# Patient Record
Sex: Male | Born: 1940 | Race: Black or African American | Hispanic: No | Marital: Married | Smoking: Never smoker
Health system: Southern US, Community
[De-identification: ages and names within clinical notes are randomized; demographics above are authoritative.]

## PROBLEM LIST (undated history)

## (undated) DIAGNOSIS — I10 Essential (primary) hypertension: Secondary | ICD-10-CM

## (undated) DIAGNOSIS — E785 Hyperlipidemia, unspecified: Secondary | ICD-10-CM

## (undated) DIAGNOSIS — E039 Hypothyroidism, unspecified: Secondary | ICD-10-CM

## (undated) DIAGNOSIS — M25469 Effusion, unspecified knee: Secondary | ICD-10-CM

## (undated) DIAGNOSIS — N400 Enlarged prostate without lower urinary tract symptoms: Secondary | ICD-10-CM

## (undated) DIAGNOSIS — F419 Anxiety disorder, unspecified: Secondary | ICD-10-CM

## (undated) DIAGNOSIS — E119 Type 2 diabetes mellitus without complications: Secondary | ICD-10-CM

## (undated) HISTORY — PX: BACK SURGERY: SHX140

## (undated) HISTORY — DX: Hyperlipidemia, unspecified: E78.5

## (undated) HISTORY — DX: Hypothyroidism, unspecified: E03.9

## (undated) HISTORY — DX: Benign prostatic hyperplasia without lower urinary tract symptoms: N40.0

## (undated) HISTORY — DX: Type 2 diabetes mellitus without complications: E11.9

## (undated) HISTORY — DX: Essential (primary) hypertension: I10

---

## 2004-09-23 ENCOUNTER — Ambulatory Visit: Payer: Self-pay | Admitting: Endocrinology

## 2006-07-14 ENCOUNTER — Ambulatory Visit: Payer: Self-pay | Admitting: Internal Medicine

## 2006-09-25 ENCOUNTER — Ambulatory Visit: Payer: Self-pay | Admitting: Internal Medicine

## 2007-11-16 ENCOUNTER — Encounter: Payer: Self-pay | Admitting: Internal Medicine

## 2007-11-26 ENCOUNTER — Telehealth (INDEPENDENT_AMBULATORY_CARE_PROVIDER_SITE_OTHER): Payer: Self-pay | Admitting: *Deleted

## 2007-11-28 ENCOUNTER — Encounter: Payer: Self-pay | Admitting: Internal Medicine

## 2008-10-13 ENCOUNTER — Telehealth: Payer: Self-pay | Admitting: Internal Medicine

## 2008-10-13 ENCOUNTER — Encounter: Payer: Self-pay | Admitting: Internal Medicine

## 2008-10-30 ENCOUNTER — Ambulatory Visit: Payer: Self-pay | Admitting: Internal Medicine

## 2008-10-30 DIAGNOSIS — E039 Hypothyroidism, unspecified: Secondary | ICD-10-CM

## 2008-10-30 DIAGNOSIS — R209 Unspecified disturbances of skin sensation: Secondary | ICD-10-CM

## 2009-03-20 ENCOUNTER — Telehealth: Payer: Self-pay | Admitting: Internal Medicine

## 2009-09-15 ENCOUNTER — Ambulatory Visit: Payer: Self-pay | Admitting: Internal Medicine

## 2009-09-22 ENCOUNTER — Encounter: Payer: Self-pay | Admitting: Internal Medicine

## 2009-09-22 ENCOUNTER — Ambulatory Visit: Payer: Self-pay | Admitting: Internal Medicine

## 2009-09-22 HISTORY — PX: COLONOSCOPY: SHX174

## 2009-09-22 LAB — HM COLONOSCOPY

## 2009-09-25 ENCOUNTER — Encounter: Payer: Self-pay | Admitting: Internal Medicine

## 2009-11-30 ENCOUNTER — Ambulatory Visit: Payer: Self-pay | Admitting: Internal Medicine

## 2010-06-23 ENCOUNTER — Telehealth (INDEPENDENT_AMBULATORY_CARE_PROVIDER_SITE_OTHER): Payer: Self-pay | Admitting: *Deleted

## 2010-06-30 ENCOUNTER — Telehealth (INDEPENDENT_AMBULATORY_CARE_PROVIDER_SITE_OTHER): Payer: Self-pay | Admitting: *Deleted

## 2010-07-05 ENCOUNTER — Telehealth (INDEPENDENT_AMBULATORY_CARE_PROVIDER_SITE_OTHER): Payer: Self-pay | Admitting: *Deleted

## 2010-09-16 ENCOUNTER — Ambulatory Visit: Payer: Self-pay | Admitting: Internal Medicine

## 2010-09-16 LAB — CONVERTED CEMR LAB
Albumin: 3.9 g/dL (ref 3.5–5.2)
Alkaline Phosphatase: 52 units/L (ref 39–117)
Basophils Absolute: 0 10*3/uL (ref 0.0–0.1)
Basophils Relative: 1.3 % (ref 0.0–3.0)
Bilirubin Urine: NEGATIVE
CO2: 25 meq/L (ref 19–32)
Calcium: 8.8 mg/dL (ref 8.4–10.5)
Chloride: 101 meq/L (ref 96–112)
Eosinophils Absolute: 0.1 10*3/uL (ref 0.0–0.7)
Glucose, Bld: 101 mg/dL — ABNORMAL HIGH (ref 70–99)
HDL: 30.6 mg/dL — ABNORMAL LOW (ref >39.00)
Hemoglobin: 12.7 g/dL — ABNORMAL LOW (ref 13.0–17.0)
Leukocytes, UA: NEGATIVE
Lymphocytes Relative: 44 % (ref 12.0–46.0)
Lymphs Abs: 1.6 10*3/uL (ref 0.7–4.0)
MCHC: 33.8 g/dL (ref 30.0–36.0)
MCV: 87.4 fL (ref 78.0–100.0)
Monocytes Absolute: 0.5 10*3/uL (ref 0.1–1.0)
Neutro Abs: 1.3 10*3/uL — ABNORMAL LOW (ref 1.4–7.7)
Nitrite: NEGATIVE
PSA: 0.53 ng/mL (ref 0.10–4.00)
RBC: 4.3 M/uL (ref 4.22–5.81)
RDW: 14.8 % — ABNORMAL HIGH (ref 11.5–14.6)
Sodium: 133 meq/L — ABNORMAL LOW (ref 135–145)
TSH: 4.51 microintl units/mL (ref 0.35–5.50)
Total Protein, Urine: NEGATIVE mg/dL
Total Protein: 7 g/dL (ref 6.0–8.3)
Triglycerides: 82 mg/dL (ref 0.0–149.0)
Urobilinogen, UA: 0.2 (ref 0.0–1.0)

## 2010-09-23 ENCOUNTER — Ambulatory Visit: Payer: Self-pay | Admitting: Internal Medicine

## 2010-09-23 ENCOUNTER — Encounter: Payer: Self-pay | Admitting: Internal Medicine

## 2010-09-23 DIAGNOSIS — N400 Enlarged prostate without lower urinary tract symptoms: Secondary | ICD-10-CM | POA: Insufficient documentation

## 2010-12-04 ENCOUNTER — Emergency Department (HOSPITAL_COMMUNITY)
Admission: EM | Admit: 2010-12-04 | Discharge: 2010-12-04 | Payer: Self-pay | Source: Home / Self Care | Admitting: Emergency Medicine

## 2010-12-19 LAB — CONVERTED CEMR LAB
Basophils Absolute: 0 10*3/uL (ref 0.0–0.1)
Bilirubin Urine: NEGATIVE
Bilirubin, Direct: 0.1 mg/dL (ref 0.0–0.3)
Calcium: 9.1 mg/dL (ref 8.4–10.5)
Creatinine, Ser: 0.9 mg/dL (ref 0.4–1.5)
Eosinophils Absolute: 0.1 10*3/uL (ref 0.0–0.7)
GFR calc Af Amer: 109 mL/min
HCT: 39.6 % (ref 39.0–52.0)
HDL: 28 mg/dL — ABNORMAL LOW (ref >39.0)
Hemoglobin: 13.3 g/dL (ref 13.0–17.0)
Ketones, ur: NEGATIVE mg/dL
Leukocytes, UA: NEGATIVE
MCHC: 33.6 g/dL (ref 30.0–36.0)
MCV: 88.8 fL (ref 78.0–100.0)
Monocytes Absolute: 0.5 10*3/uL (ref 0.1–1.0)
Neutro Abs: 1.3 10*3/uL — ABNORMAL LOW (ref 1.4–7.7)
PSA: 0.73 ng/mL (ref 0.10–4.00)
RDW: 14.1 % (ref 11.5–14.6)
Sodium: 139 meq/L (ref 135–145)
TSH: 1.64 microintl units/mL (ref 0.35–5.50)
Total Bilirubin: 0.8 mg/dL (ref 0.3–1.2)
Total CHOL/HDL Ratio: 5.3
Total Protein, Urine: NEGATIVE mg/dL
Total Protein: 7.5 g/dL (ref 6.0–8.3)
Triglycerides: 81 mg/dL (ref 0–149)
Urine Glucose: NEGATIVE mg/dL
Vitamin B-12: 280 pg/mL (ref 211–911)
pH: 6 (ref 5.0–8.0)

## 2010-12-23 NOTE — Assessment & Plan Note (Signed)
Summary: flu shot/plot/cd  Nurse Visit   Allergies: No Known Drug Allergies  Orders Added: 1)  Flu Vaccine 75yrs + [90658] 2)  Administration Flu vaccine - MCR [G0008] Flu Vaccine Consent Questions     Do you have a history of severe allergic reactions to this vaccine? no    Any prior history of allergic reactions to egg and/or gelatin? no    Do you have a sensitivity to the preservative Thimersol? no    Do you have a past history of Guillan-Barre Syndrome? no    Do you currently have an acute febrile illness? no    Have you ever had a severe reaction to latex? no    Vaccine information given and explained to patient? yes    Are you currently pregnant? no    Lot Number:[id number]   Exp Date:05/20/2010   Site Given  Left Deltoid IM.lbmedflu

## 2010-12-23 NOTE — Progress Notes (Signed)
  Phone Note Other Incoming   Request: Send information Summary of Call: Request for records received from [facility]. Request forwarded to [facility].     

## 2010-12-23 NOTE — Progress Notes (Signed)
  Phone Note Other Incoming   Request: Send information Action Taken: Software engineer of Call: Request for records received from [facility]. Request forwarded to [facility] to send last 5 years worth of records.

## 2010-12-23 NOTE — Assessment & Plan Note (Signed)
Summary: CPX/ SECURE HORIZIONS/NWS    #   Vital Signs:  Patient profile:   70 year old male Height:      73 inches Weight:      233 pounds BMI:     30.85 Temp:     98.5 degrees F oral Pulse rate:   72 / minute Pulse rhythm:   regular Resp:     16 per minute BP sitting:   110 / 76  (left arm) Cuff size:   regular  Vitals Entered By: Lanier Prude, Beverly Gust) (September 23, 2010 10:39 AM) CC: MWV Is Patient Diabetic? No Comments pt is not taking Vit b12   CC:  MWV.  History of Present Illness: The patient presents for a preventive health examination  Patient past medical history, social history, and family history reviewed in detail no significant changes.  Patient is physically active. Depression is negative and mood is good. Hearing is decreased, and able to perform activities of daily living. Risk of falling is negligible and home safety has been reviewed and is appropriate. Patient has normal height, some overweight, and visual acuity w/reading glasses. Patient has been counseled on age-appropriate routine health concerns for screening and prevention. Education, counseling done.   Preventive Screening-Counseling & Management  Alcohol-Tobacco     Alcohol drinks/day: <1     Smoking Status: quit > 6 months  Caffeine-Diet-Exercise     Caffeine Counseling: not indicated; caffeine use is not excessive or problematic     Diet Counseling: to improve diet; diet is suboptimal     Does Patient Exercise: yes     Type of exercise: walking     Exercise Counseling: not indicated; exercise is adequate     Depression Counseling: not indicated; screening negative for depression  Hep-HIV-STD-Contraception     Hepatitis Risk: no risk noted     Sun Exposure Counseling: to decrease sun exposure  Safety-Violence-Falls     Seat Belt Use: yes     Fall Risk Counseling: not indicated; no significant falls noted  Current Medications (verified): 1)  Levothyroxine Sodium 100 Mcg  Tabs  (Levothyroxine Sodium) .... Once Daily 2)  Aspirin 81 Mg  Tbec (Aspirin) .... One By Mouth Every Day 3)  Vitamin D3 1000 Unit  Tabs (Cholecalciferol) .Marland Kitchen.. 1 By Mouth Daily 4)  Vitamin B-12 Cr 1000 Mcg  Tbcr (Cyanocobalamin) .... Take One Tablet By Mouth Daily  Allergies (verified): No Known Drug Allergies  Past History:  Family History: Last updated: 11-18-08 F well 4 M died  Past Medical History: Hypothyroidism Benign prostatic hypertrophy  Social History: Smoking Status:  quit > 6 months Hepatitis Risk:  no risk noted Seat Belt Use:  yes  Review of Systems  The patient denies anorexia, fever, weight loss, weight gain, vision loss, decreased hearing, hoarseness, chest pain, syncope, dyspnea on exertion, peripheral edema, prolonged cough, headaches, hemoptysis, abdominal pain, melena, hematochezia, severe indigestion/heartburn, hematuria, incontinence, genital sores, muscle weakness, suspicious skin lesions, transient blindness, difficulty walking, depression, unusual weight change, abnormal bleeding, enlarged lymph nodes, angioedema, and testicular masses.    Physical Exam  General:  Well-developed, in no acute distress; alert,appropriate and cooperative throughout examination overweight-appearing.   Head:  Normocephalic and atraumatic without obvious abnormalities. No apparent alopecia or balding. Eyes:  No new corneal or conjunctival inflammation noted. EOMI. Perrla. Funduscopic exam benign, without hemorrhages, exudates or papilledema. Vision grossly normal. Ears:  External ear exam shows no significant lesions or deformities.  Otoscopic examination reveals clear canals, tympanic membranes  are intact bilaterally without bulging, retraction, inflammation or discharge. Hearing is grossly normal bilaterally. Nose:  External nasal examination shows no deformity or inflammation. Nasal mucosa are pink and moist without lesions or exudates. Mouth:  Oral mucosa and oropharynx  without lesions or exudates.  Teeth in good repair. Neck:  No deformities, masses, or tenderness noted. Lungs:  Normal respiratory effort, chest expands symmetrically. Lungs are clear to auscultation, no crackles or wheezes. Heart:  Normal rate and regular rhythm. S1 and S2 normal without gallop, murmur, click, rub or other extra sounds. Abdomen:  Bowel sounds positive,abdomen soft and non-tender without masses, organomegaly or hernias noted. Genitalia:  Testes bilaterally descended without nodularity, tenderness or masses. No scrotal masses or lesions. No penis lesions or urethral discharge. Prostate:  Prostate gland firm and smooth,1+ enlargement, nodularity, tenderness, mass, asymmetry or induration. Msk:  No deformity or scoliosis noted of thoracic or lumbar spine.   Pulses:  R and L carotid,radial,femoral,dorsalis pedis and posterior tibial pulses are full and equal bilaterally Extremities:  no edema B  Neurologic:  No cranial nerve deficits noted. Station and gait are normal. Plantar reflexes are down-going bilaterally. DTRs are symmetrical throughout. Sensory, motor and coordinative functions appear intact. Skin:  Intact without suspicious lesions or rashes Cervical Nodes:  No lymphadenopathy noted Psych:  Cognition and judgment appear intact. Alert and cooperative with normal attention span and concentration. No apparent delusions, illusions, hallucinations   Impression & Recommendations:  Problem # 1:  WELL ADULT EXAM (ICD-V70.0) Assessment New Overall doing well, age appropriate education and counseling updated and referral for appropriate preventive services done unless declined, immunizations up to date or declined, diet counseling done if overweight, urged to quit smoking if smokes, most recent labs reviewed and current ordered if appropriate, ecg reviewed or declined (interpretation per ECG scanned in the EMR if done); information regarding Medicare Preventation requirements given  if appropriate.  The labs were reviewed with the patient.  Orders: EKG w/ Interpretation (93000) Medicare -1st Annual Wellness Visit [phone]  Problem # 2:  HYPOTHYROIDISM (ICD-244.9) Assessment: Deteriorated  The following medications were removed from the medication list:    Levothyroxine Sodium 100 Mcg Tabs (Levothyroxine sodium) ..... Once daily His updated medication list for this problem includes:    Levoxyl 112 Mcg Tabs (Levothyroxine sodium) .Marland Kitchen... 1 by mouth qd  Problem # 3:  BENIGN PROSTATIC HYPERTROPHY (ICD-600.00) Assessment: Unchanged  Complete Medication List: 1)  Aspirin 81 Mg Tbec (Aspirin) .... One by mouth every day 2)  Vitamin D3 1000 Unit Tabs (Cholecalciferol) .Marland Kitchen.. 1 by mouth daily 3)  Vitamin B-12 Cr 1000 Mcg Tbcr (Cyanocobalamin) .... Take one tablet by mouth daily 4)  Levoxyl 112 Mcg Tabs (Levothyroxine sodium) .Marland Kitchen.. 1 by mouth qd 5)  Zostavax 47425 Unt/0.35ml Solr (Zoster vaccine live) .... As dirr 6)  Triamcinolone Acetonide 0.5 % Crea (Triamcinolone acetonide) .... Use two times a day prn  Other Orders: Pneumococcal Vaccine (95638) Admin 1st Vaccine (75643)  Patient Instructions: 1)  Please schedule a follow-up appointment in 4 months. 2)  BMP prior to visit, ICD-9:244.8 995.20 3)  TSH prior to visit, ICD-9: 4)  CBC w/ Diff prior to visit, ICD-9: 5)  Vit B12 782.0 Prescriptions: TRIAMCINOLONE ACETONIDE 0.5 % CREA (TRIAMCINOLONE ACETONIDE) use two times a day prn  #120 g x 3   Entered and Authorized by:   Tresa Garter MD   Signed by:   Tresa Garter MD on 09/23/2010   Method used:   Print then  Give to Patient   RxID:   [id number] ZOSTAVAX 14782 UNT/0.65ML SOLR (ZOSTER VACCINE LIVE) as dirr  #1 x 0   Entered and Authorized by:   Tresa Garter MD   Signed by:   Tresa Garter MD on 09/23/2010   Method used:   Print then Give to Patient   RxID:   [id number] LEVOXYL 112 MCG TABS (LEVOTHYROXINE SODIUM) 1 by mouth qd   #30 x 11   Entered and Authorized by:   Tresa Garter MD   Signed by:   Tresa Garter MD on 09/23/2010   Method used:   Print then Give to Patient   RxID:   [id number]    Orders Added: 1)  EKG w/ Interpretation [93000] 2)  Pneumococcal Vaccine [25366] 3)  Admin 1st Vaccine [90471] 4)  Medicare -1st Annual Wellness Visit [G0438] 5)  Est. Patient Level III [44034]   Immunizations Administered:  Pneumonia Vaccine:    Vaccine Type: Pneumovax    Site: left deltoid    Mfr: Merck    Dose: 0.5 ml    Route: IM    Given by: Lanier Prude, CMA(AAMA)    Exp. Date: 03/17/2012    Lot #: [id number]    VIS given: 10/26/09 version given September 23, 2010.   Immunizations Administered:  Pneumonia Vaccine:    Vaccine Type: Pneumovax    Site: left deltoid    Mfr: Merck    Dose: 0.5 ml    Route: IM    Given by: Lanier Prude, CMA(AAMA)    Exp. Date: 03/17/2012    Lot #: [id number]    VIS given: 10/26/09 version given September 23, 2010.

## 2010-12-23 NOTE — Progress Notes (Signed)
  Phone Note Other Incoming   Request: Send information Summary of Call: Request for records received from  [facility]. Request forwarded to [facility].

## 2010-12-30 ENCOUNTER — Ambulatory Visit (INDEPENDENT_AMBULATORY_CARE_PROVIDER_SITE_OTHER): Payer: MEDICARE | Admitting: Internal Medicine

## 2010-12-30 ENCOUNTER — Other Ambulatory Visit: Payer: MEDICARE

## 2010-12-30 ENCOUNTER — Encounter: Payer: Self-pay | Admitting: Internal Medicine

## 2010-12-30 ENCOUNTER — Other Ambulatory Visit: Payer: Self-pay | Admitting: Internal Medicine

## 2010-12-30 DIAGNOSIS — R42 Dizziness and giddiness: Secondary | ICD-10-CM

## 2010-12-30 DIAGNOSIS — H538 Other visual disturbances: Secondary | ICD-10-CM

## 2010-12-30 DIAGNOSIS — S060XAA Concussion with loss of consciousness status unknown, initial encounter: Secondary | ICD-10-CM | POA: Insufficient documentation

## 2010-12-30 DIAGNOSIS — R209 Unspecified disturbances of skin sensation: Secondary | ICD-10-CM

## 2010-12-30 DIAGNOSIS — S060X9A Concussion with loss of consciousness of unspecified duration, initial encounter: Secondary | ICD-10-CM | POA: Insufficient documentation

## 2010-12-30 DIAGNOSIS — R51 Headache: Secondary | ICD-10-CM

## 2010-12-30 LAB — CBC WITH DIFFERENTIAL/PLATELET
Basophils Absolute: 0 10*3/uL (ref 0.0–0.1)
Eosinophils Absolute: 0.2 10*3/uL (ref 0.0–0.7)
HCT: 38.2 % — ABNORMAL LOW (ref 39.0–52.0)
Hemoglobin: 13 g/dL (ref 13.0–17.0)
Lymphocytes Relative: 43.9 % (ref 12.0–46.0)
Lymphs Abs: 1.6 10*3/uL (ref 0.7–4.0)
MCHC: 34.1 g/dL (ref 30.0–36.0)
Neutro Abs: 1.4 10*3/uL (ref 1.4–7.7)
RDW: 14.6 % (ref 11.5–14.6)

## 2010-12-30 LAB — BASIC METABOLIC PANEL
CO2: 27 mEq/L (ref 19–32)
Calcium: 9.1 mg/dL (ref 8.4–10.5)
Glucose, Bld: 97 mg/dL (ref 70–99)
Sodium: 136 mEq/L (ref 135–145)
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
## 2011-01-20 ENCOUNTER — Other Ambulatory Visit: Payer: Self-pay

## 2011-01-27 ENCOUNTER — Ambulatory Visit (INDEPENDENT_AMBULATORY_CARE_PROVIDER_SITE_OTHER): Payer: Medicare Other | Admitting: Internal Medicine

## 2011-01-27 ENCOUNTER — Encounter: Payer: Self-pay | Admitting: Internal Medicine

## 2011-01-27 DIAGNOSIS — S060XAA Concussion with loss of consciousness status unknown, initial encounter: Secondary | ICD-10-CM

## 2011-01-27 DIAGNOSIS — S060X9A Concussion with loss of consciousness of unspecified duration, initial encounter: Secondary | ICD-10-CM

## 2011-01-27 DIAGNOSIS — H538 Other visual disturbances: Secondary | ICD-10-CM

## 2011-01-27 DIAGNOSIS — R350 Frequency of micturition: Secondary | ICD-10-CM | POA: Insufficient documentation

## 2011-01-28 ENCOUNTER — Ambulatory Visit: Payer: MEDICARE | Admitting: Internal Medicine

## 2011-02-01 ENCOUNTER — Telehealth: Payer: Self-pay | Admitting: Internal Medicine

## 2011-02-01 NOTE — Assessment & Plan Note (Signed)
Summary: 4 MO FU  Blake Zamora  Blake Zamora   Vital Signs:  Patient profile:   70 year old male Height:      73 inches Weight:      234 pounds BMI:     30.98 Temp:     98.5 degrees F oral Pulse rate:   64 / minute Pulse rhythm:   regular Resp:     16 per minute BP sitting:   112 / 76  (left arm) Cuff size:   regular  Vitals Entered By: Lanier Prude, CMA(AAMA) (January 27, 2011 8:55 AM) CC: 4 mo f/u c/o intermittent frequent urination Is Patient Diabetic? No   CC:  4 mo f/u c/o intermittent frequent urination.  History of Present Illness: C/o frequent urination x months - more at night - q 2 hrs x months  The patient presents for a follow up of anxiety, blurred vision and dizziness - better  Current Medications (verified): 1)  Aspirin 81 Mg  Tbec (Aspirin) .... One By Mouth Every Day 2)  Vitamin D3 1000 Unit  Tabs (Cholecalciferol) .Marland Kitchen.. 1 By Mouth Daily 3)  Vitamin B-12 Cr 1000 Mcg  Tbcr (Cyanocobalamin) .... Take One Tablet By Mouth Daily 4)  Levoxyl 112 Mcg Tabs (Levothyroxine Sodium) .Marland Kitchen.. 1 By Mouth Qd 5)  Zostavax 16109 Unt/0.53ml Solr (Zoster Vaccine Live) .... As Dirr 6)  Triamcinolone Acetonide 0.5 % Crea (Triamcinolone Acetonide) .... Use Two Times A Day Prn 7)  Ibuprofen 600 Mg Tabs (Ibuprofen) .Marland Kitchen.. 1 By Mouth Bid  Pc X 1 Wk Then As Needed For  Pain 8)  Meclizine Hcl 12.5 Mg Tabs (Meclizine Hcl) .Marland Kitchen.. 1-2 By Mouth Qid As Needed Dizziness 9)  Vitamin B Complex  Tabs (B Complex Vitamins) .Marland Kitchen.. 1 By Mouth Qd  Allergies (verified): No Known Drug Allergies  Past History:  Past Medical History: Last updated: 09/23/2010 Hypothyroidism Benign prostatic hypertrophy  Past Surgical History: Last updated: 12/30/2010 Denies surgical history  Social History: Last updated: 10/30/2008 Retired Married Regular exercise-yes  Review of Systems  The patient denies abdominal pain, genital sores, difficulty walking, and depression.    Physical Exam  General:  Well-developed, in no  acute distress; alert,appropriate and cooperative throughout examination overweight-appearing.   Nose:  External nasal examination shows no deformity or inflammation. Nasal mucosa are pink and moist without lesions or exudates. Mouth:  Oral mucosa and oropharynx without lesions or exudates.  Teeth in good repair. Lungs:  Normal respiratory effort, chest expands symmetrically. Lungs are clear to auscultation, no crackles or wheezes. Heart:  Normal rate and regular rhythm. S1 and S2 normal without gallop, murmur, click, rub or other extra sounds. Abdomen:  Bowel sounds positive,abdomen soft and non-tender without masses, organomegaly or hernias noted. Msk:  No deformity or scoliosis noted of thoracic or lumbar spine.   Neurologic:  No cranial nerve deficits noted. Station and gait are normal. Plantar reflexes are down-going bilaterally. DTRs are symmetrical throughout. Sensory, motor and coordinative functions appear intact. H-P (-) B. Skin:  Intact without suspicious lesions or rashes Cervical Nodes:  No lymphadenopathy noted Psych:  Cognition and judgment appear intact. Alert and cooperative with normal attention span and concentration. No apparent delusions, illusions, hallucinations   Impression & Recommendations:  Problem # 1:  FREQUENCY, URINARY (ICD-788.41) due to  BPH Assessment New  His updated medication list for this problem includes:    Flomax 0.4 Mg Caps (Tamsulosin hcl) .Marland Kitchen... 1 by mouth once daily for prostate to try The labs were reviewed  with the patient.   Problem # 2:  HYPOTHYROIDISM (ICD-244.9) Assessment: Unchanged  His updated medication list for this problem includes:    Levoxyl 112 Mcg Tabs (Levothyroxine sodium) .Marland Kitchen... 1 by mouth qd  Problem # 3:  BLURRED VISION (ICD-368.8) Assessment: Improved Resolved  Problem # 4:  CONCUSSION (ICD-850.9) Assessment: Improved No HAs lately  Problem # 5:  DIZZINESS (ICD-780.4) Assessment: Improved  His updated medication  list for this problem includes:    Meclizine Hcl 12.5 Mg Tabs (Meclizine hcl) .Marland Kitchen... 1-2 by mouth qid as needed dizziness  Complete Medication List: 1)  Aspirin 81 Mg Tbec (Aspirin) .... One by mouth every day 2)  Vitamin D3 1000 Unit Tabs (Cholecalciferol) .Marland Kitchen.. 1 by mouth daily 3)  Vitamin B-12 Cr 1000 Mcg Tbcr (Cyanocobalamin) .... Take one tablet by mouth daily 4)  Levoxyl 112 Mcg Tabs (Levothyroxine sodium) .Marland Kitchen.. 1 by mouth qd 5)  Zostavax 16109 Unt/0.22ml Solr (Zoster vaccine live) .... As dirr 6)  Triamcinolone Acetonide 0.5 % Crea (Triamcinolone acetonide) .... Use two times a day prn 7)  Ibuprofen 600 Mg Tabs (Ibuprofen) .Marland Kitchen.. 1 by mouth bid  pc x 1 wk then as needed for  pain 8)  Meclizine Hcl 12.5 Mg Tabs (Meclizine hcl) .Marland Kitchen.. 1-2 by mouth qid as needed dizziness 9)  Vitamin B Complex Tabs (B complex vitamins) .Marland Kitchen.. 1 by mouth qd 10)  Flomax 0.4 Mg Caps (Tamsulosin hcl) .Marland Kitchen.. 1 by mouth once daily for prostate  Patient Instructions: 1)  Please schedule a follow-up appointment in 2 months. Prescriptions: FLOMAX 0.4 MG CAPS (TAMSULOSIN HCL) 1 by mouth once daily for prostate  #30 x 12   Entered and Authorized by:   Tresa Garter MD   Signed by:   Tresa Garter MD on 01/27/2011   Method used:   Electronically to        [facility]  [address] [phone]* (retail)       [address]       [place], [place]  [id number]       Ph: [phone] or [phone]       Fax: [phone]   RxID:   [id number]    Orders Added: 1)  Est. Patient Level IV [42595]

## 2011-02-08 NOTE — Progress Notes (Signed)
Summary: RF  Phone Note Call from Patient Call back at Home Phone [phone]   Caller: Wife Summary of Call: Req rx for thyroid med to be changed to 60 day supply instead of 30 day supply. ? does pt mean 90 day supply? Need to call and clarify  Initial call taken by: Lamar Sprinkles, CMA,  February 01, 2011 2:23 PM  Follow-up for Phone Call        left vm for pt to call office back w/details for rx refill  Follow-up by: Lamar Sprinkles, CMA,  February 03, 2011 12:24 PM  Additional Follow-up for Phone Call Additional follow up Details #1::        Needs refill of thyroid med to [facility] [address] Additional Follow-up by: Lamar Sprinkles, CMA,  February 03, 2011 12:59 PM    Prescriptions: LEVOXYL 112 MCG TABS (LEVOTHYROXINE SODIUM) 1 by mouth qd  #90 x 1   Entered by:   Lamar Sprinkles, CMA   Authorized by:   Tresa Garter MD   Signed by:   Lamar Sprinkles, CMA on 02/03/2011   Method used:   [facility] to        [facility]  [address] [phone]* (retail)       [address]       [place], [place]  [id number]       Ph: [phone] or [phone]       Fax: [phone]   RxID:   [id number]

## 2011-04-05 ENCOUNTER — Encounter: Payer: Self-pay | Admitting: Internal Medicine

## 2011-04-07 ENCOUNTER — Ambulatory Visit (INDEPENDENT_AMBULATORY_CARE_PROVIDER_SITE_OTHER): Payer: Medicare Other | Admitting: Internal Medicine

## 2011-04-07 ENCOUNTER — Encounter: Payer: Self-pay | Admitting: Internal Medicine

## 2011-04-07 DIAGNOSIS — R42 Dizziness and giddiness: Secondary | ICD-10-CM

## 2011-04-07 DIAGNOSIS — E039 Hypothyroidism, unspecified: Secondary | ICD-10-CM

## 2011-04-07 DIAGNOSIS — S060X9A Concussion with loss of consciousness of unspecified duration, initial encounter: Secondary | ICD-10-CM

## 2011-04-07 NOTE — Progress Notes (Signed)
  Subjective:    Patient ID: Blake Zamora, male    DOB: [birth year], 70 y.o.   MRN: [id number]  HPI  F/u concussion and dizziness  Review of Systems  Constitutional: Negative for fatigue.  HENT: Negative for dental problem and tinnitus.   Eyes: Negative for pain.  Respiratory: Negative for cough.   Gastrointestinal: Negative for blood in stool.  Musculoskeletal: Negative for gait problem.  Skin: Negative for rash.  Neurological: Negative for dizziness and light-headedness.  Psychiatric/Behavioral: The patient is not nervous/anxious.        Objective:   Physical Exam  Constitutional: No distress.  HENT:  Mouth/Throat: No oropharyngeal exudate.  Neck: No JVD present.  Pulmonary/Chest: He has no wheezes.  Abdominal: He exhibits no distension.  Musculoskeletal: He exhibits no edema and no tenderness.  Neurological: No cranial nerve deficit. Coordination normal.  Skin: No rash noted.          Assessment & Plan:  CONCUSSION Concussion sx have resolved completely  DIZZINESS Improved  HYPOTHYROIDISM On Rx

## 2011-04-07 NOTE — Assessment & Plan Note (Signed)
Concussion

## 2011-04-07 NOTE — Assessment & Plan Note (Signed)
On Rx 

## 2011-04-07 NOTE — Assessment & Plan Note (Signed)
Improved

## 2011-04-10 ENCOUNTER — Encounter: Payer: Self-pay | Admitting: Internal Medicine

## 2011-08-11 ENCOUNTER — Other Ambulatory Visit: Payer: Self-pay | Admitting: Internal Medicine

## 2011-09-26 ENCOUNTER — Other Ambulatory Visit: Payer: Self-pay | Admitting: *Deleted

## 2011-09-26 DIAGNOSIS — Z0000 Encounter for general adult medical examination without abnormal findings: Secondary | ICD-10-CM

## 2011-09-26 DIAGNOSIS — Z0389 Encounter for observation for other suspected diseases and conditions ruled out: Secondary | ICD-10-CM

## 2011-09-29 ENCOUNTER — Other Ambulatory Visit: Payer: Medicare Other

## 2011-10-03 ENCOUNTER — Encounter: Payer: Self-pay | Admitting: Internal Medicine

## 2011-10-03 ENCOUNTER — Ambulatory Visit (INDEPENDENT_AMBULATORY_CARE_PROVIDER_SITE_OTHER): Payer: Medicare Other | Admitting: Internal Medicine

## 2011-10-03 ENCOUNTER — Other Ambulatory Visit (INDEPENDENT_AMBULATORY_CARE_PROVIDER_SITE_OTHER): Payer: Medicare Other

## 2011-10-03 VITALS — BP 140/80 | HR 72 | Temp 98.2°F | Resp 16 | Wt 230.0 lb

## 2011-10-03 DIAGNOSIS — Z136 Encounter for screening for cardiovascular disorders: Secondary | ICD-10-CM

## 2011-10-03 DIAGNOSIS — R350 Frequency of micturition: Secondary | ICD-10-CM

## 2011-10-03 DIAGNOSIS — IMO0001 Reserved for inherently not codable concepts without codable children: Secondary | ICD-10-CM

## 2011-10-03 DIAGNOSIS — E039 Hypothyroidism, unspecified: Secondary | ICD-10-CM

## 2011-10-03 DIAGNOSIS — N400 Enlarged prostate without lower urinary tract symptoms: Secondary | ICD-10-CM

## 2011-10-03 DIAGNOSIS — R209 Unspecified disturbances of skin sensation: Secondary | ICD-10-CM

## 2011-10-03 DIAGNOSIS — R030 Elevated blood-pressure reading, without diagnosis of hypertension: Secondary | ICD-10-CM

## 2011-10-03 DIAGNOSIS — Z0000 Encounter for general adult medical examination without abnormal findings: Secondary | ICD-10-CM

## 2011-10-03 DIAGNOSIS — Z125 Encounter for screening for malignant neoplasm of prostate: Secondary | ICD-10-CM

## 2011-10-03 MED ORDER — FINASTERIDE 5 MG PO TABS: 5.0000 mg | ORAL_TABLET | Freq: Every day | ORAL | Status: DC

## 2011-10-03 NOTE — Assessment & Plan Note (Signed)
Due to BPH - worse in 2012

## 2011-10-03 NOTE — Assessment & Plan Note (Signed)
Check labs 

## 2011-10-03 NOTE — Assessment & Plan Note (Signed)
Start Proscar. 

## 2011-10-03 NOTE — Assessment & Plan Note (Signed)
2012 Watch BP at home

## 2011-10-03 NOTE — Progress Notes (Signed)
  Subjective:    Patient ID: Blake Zamora, male    DOB: December 27, 1940, 70 y.o.   MRN: [id number]  HPI  The patient is here for a wellness exam. The patient has been doing well overall without major physical or psychological issues going on lately. 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  HENT: Negative for nosebleeds, congestion, sore throat, sneezing, trouble swallowing and neck pain.   Eyes: Negative for itching and visual disturbance.  Respiratory: Negative for cough.   Cardiovascular: Negative for chest pain, palpitations and leg swelling.  Gastrointestinal: Negative for nausea, diarrhea, blood in stool and abdominal distention.  Genitourinary: Positive for urgency, frequency and decreased urine volume. Negative for hematuria.  Musculoskeletal: Negative for back pain, joint swelling and gait problem.  Skin: Negative for rash.  Neurological: Negative for dizziness, tremors, speech difficulty and weakness.  Psychiatric/Behavioral: Negative for sleep disturbance, dysphoric mood and agitation. The patient is not nervous/anxious.        Objective:   Physical Exam  Constitutional: He is oriented to person, place, and time. He appears well-developed and well-nourished. No distress.  HENT:  Head: Normocephalic and atraumatic.  Right Ear: External ear normal.  Left Ear: External ear normal.  Nose: Nose normal.  Mouth/Throat: Oropharynx is clear and moist. No oropharyngeal exudate.  Eyes: Conjunctivae and EOM are normal. Pupils are equal, round, and reactive to light. Right eye exhibits no discharge. Left eye exhibits no discharge. No scleral icterus.  Neck: Normal range of motion. Neck supple. No JVD present. No tracheal deviation present. No thyromegaly present.  Cardiovascular: Normal rate, regular rhythm, normal heart sounds and  intact distal pulses.  Exam reveals no gallop and no friction rub.   No murmur heard. Pulmonary/Chest: Effort normal and breath sounds normal. No stridor. No respiratory distress. He has no wheezes. He has no rales. He exhibits no tenderness.  Abdominal: Soft. Bowel sounds are normal. He exhibits no distension and no mass. There is no tenderness. There is no rebound and no guarding.  Genitourinary: Rectum normal and penis normal. Guaiac negative stool. No penile tenderness.       Prostate is 2+  Musculoskeletal: Normal range of motion. He exhibits no edema and no tenderness.  Lymphadenopathy:    He has no cervical adenopathy.  Neurological: He is alert and oriented to person, place, and time. He has normal reflexes. No cranial nerve deficit. He exhibits normal muscle tone. Coordination normal.  Skin: Skin is warm and dry. No rash noted. He is not diaphoretic. No erythema. No pallor.  Psychiatric: He has a normal mood and affect. His behavior is normal. Judgment and thought content normal.          Assessment & Plan:

## 2011-10-03 NOTE — Assessment & Plan Note (Signed)
Continue with current prescription therapy as reflected on the Med list.  

## 2011-10-03 NOTE — Assessment & Plan Note (Addendum)
The patient is here for annual Medicare wellness examination and management of other chronic and acute problems.   The risk factors are reflected in the social history.  The roster of all physicians providing medical care to patient - is listed in the Snapshot section of the chart.  Activities of daily living:  The patient is 100% inedpendent in all ADLs: dressing, toileting, feeding as well as independent mobility  Home safety : The patient has smoke detectors in the home. They wear seatbelts.No firearms at home ( firearms are present in the home, kept in a safe fashion). There is no violence in the home.   There is no risks for hepatitis, STDs or HIV. There is no   history of blood transfusion. They have no travel history to infectious disease endemic areas of the world.  The patient has (has not) seen their dentist in the last six month. They have (not) seen their eye doctor in the last year. They deny (admit to) any hearing difficulty and have not had audiologic testing in the last year.  They do not  have excessive sun exposure. Discussed the need for sun protection: hats, long sleeves and use of sunscreen if there is significant sun exposure.   Diet: the importance of a healthy diet is discussed. They do have a healthy (unhealthy-high fat/fast food) diet.  The patient has a regular exercise program: _______ , ____duration, __3___per week.  The benefits of regular aerobic exercise were discussed.  Depression screen: there are no signs or vegative symptoms of depression- irritability, change in appetite, anhedonia, sadness/tearfullness.  Cognitive assessment: the patient manages all their financial and personal affairs and is actively engaged. They could relate day,date,year and events; recalled 3/3 objects at 3 minutes; performed clock-face test normally.  The following portions of the patient's history were reviewed and updated as appropriate: allergies, current medications, past family  history, past medical history,  past surgical history, past social history  and problem list.  Vision, hearing, body mass index were assessed and reviewed.   During the course of the visit the patient was educated and counseled about appropriate screening and preventive services including : fall prevention , diabetes screening, nutrition counseling, colorectal cancer screening, and recommended immunizations.  [facility] info given Forms for [facility] filled out Watch BP at home and RTC in 3 mo

## 2011-10-04 ENCOUNTER — Telehealth: Payer: Self-pay | Admitting: Internal Medicine

## 2011-10-04 LAB — HEPATIC FUNCTION PANEL
ALT: 30 U/L (ref 0–53)
AST: 26 U/L (ref 0–37)
Bilirubin, Direct: 0.1 mg/dL (ref 0.0–0.3)
Total Bilirubin: 0.7 mg/dL (ref 0.3–1.2)

## 2011-10-04 LAB — CBC WITH DIFFERENTIAL/PLATELET
Basophils Relative: 0.7 % (ref 0.0–3.0)
Eosinophils Absolute: 0.1 10*3/uL (ref 0.0–0.7)
Eosinophils Relative: 4.1 % (ref 0.0–5.0)
Hemoglobin: 13.1 g/dL (ref 13.0–17.0)
Lymphocytes Relative: 43.2 % (ref 12.0–46.0)
MCHC: 32.5 g/dL (ref 30.0–36.0)
Monocytes Relative: 14 % — ABNORMAL HIGH (ref 3.0–12.0)
Neutro Abs: 1.4 10*3/uL (ref 1.4–7.7)
Neutrophils Relative %: 38 % — ABNORMAL LOW (ref 43.0–77.0)
RBC: 4.46 Mil/uL (ref 4.22–5.81)
WBC: 3.6 10*3/uL — ABNORMAL LOW (ref 4.5–10.5)

## 2011-10-04 LAB — BASIC METABOLIC PANEL
BUN: 16 mg/dL (ref 6–23)
CO2: 27 mEq/L (ref 19–32)
Chloride: 107 mEq/L (ref 96–112)
Creatinine, Ser: 0.9 mg/dL (ref 0.4–1.5)
Glucose, Bld: 101 mg/dL — ABNORMAL HIGH (ref 70–99)
Potassium: 4.4 mEq/L (ref 3.5–5.1)

## 2011-10-04 LAB — LIPID PANEL
Cholesterol: 140 mg/dL (ref 0–200)
LDL Cholesterol: 86 mg/dL (ref 0–99)
Total CHOL/HDL Ratio: 4
Triglycerides: 110 mg/dL (ref 0.0–149.0)
VLDL: 22 mg/dL (ref 0.0–40.0)

## 2011-10-04 LAB — URINALYSIS
Bilirubin Urine: NEGATIVE
Nitrite: NEGATIVE
Total Protein, Urine: NEGATIVE
Urine Glucose: NEGATIVE
pH: 6 (ref 5.0–8.0)

## 2011-10-04 LAB — PSA: PSA: 0.77 ng/mL (ref 0.10–4.00)

## 2011-10-04 NOTE — Telephone Encounter (Signed)
Blake Zamora, please, inform patient that all labs are ok Thx 

## 2011-10-05 NOTE — Telephone Encounter (Signed)
Left detailed mess informing pt of below.  

## 2011-10-05 NOTE — Telephone Encounter (Signed)
No answer no machine

## 2012-01-09 ENCOUNTER — Ambulatory Visit: Payer: Medicare Other | Admitting: Internal Medicine

## 2012-02-15 ENCOUNTER — Other Ambulatory Visit: Payer: Self-pay | Admitting: Internal Medicine

## 2012-08-16 ENCOUNTER — Other Ambulatory Visit: Payer: Self-pay | Admitting: Internal Medicine

## 2012-09-03 ENCOUNTER — Encounter (HOSPITAL_COMMUNITY): Payer: Self-pay | Admitting: *Deleted

## 2012-09-03 ENCOUNTER — Emergency Department (HOSPITAL_COMMUNITY): Payer: Medicare Other

## 2012-09-03 DIAGNOSIS — R079 Chest pain, unspecified: Secondary | ICD-10-CM | POA: Insufficient documentation

## 2012-09-03 DIAGNOSIS — Z79899 Other long term (current) drug therapy: Secondary | ICD-10-CM | POA: Insufficient documentation

## 2012-09-03 DIAGNOSIS — E039 Hypothyroidism, unspecified: Secondary | ICD-10-CM | POA: Insufficient documentation

## 2012-09-03 DIAGNOSIS — F411 Generalized anxiety disorder: Secondary | ICD-10-CM | POA: Insufficient documentation

## 2012-09-03 LAB — COMPREHENSIVE METABOLIC PANEL
AST: 27 U/L (ref 0–37)
Albumin: 4 g/dL (ref 3.5–5.2)
Alkaline Phosphatase: 63 U/L (ref 39–117)
Chloride: 105 mEq/L (ref 96–112)
Potassium: 3.8 mEq/L (ref 3.5–5.1)
Total Bilirubin: 0.3 mg/dL (ref 0.3–1.2)
Total Protein: 7.4 g/dL (ref 6.0–8.3)

## 2012-09-03 LAB — CBC WITH DIFFERENTIAL/PLATELET
Basophils Absolute: 0 10*3/uL (ref 0.0–0.1)
Basophils Relative: 0 % (ref 0–1)
Eosinophils Absolute: 0.2 10*3/uL (ref 0.0–0.7)
MCHC: 33.9 g/dL (ref 30.0–36.0)
Neutro Abs: 2.1 10*3/uL (ref 1.7–7.7)
Neutrophils Relative %: 42 % — ABNORMAL LOW (ref 43–77)
Platelets: 163 10*3/uL (ref 150–400)
RDW: 13.3 % (ref 11.5–15.5)

## 2012-09-03 NOTE — ED Notes (Signed)
The pt has had lt upper chest pain since yesterday.  No nv dizziness or sob

## 2012-09-04 ENCOUNTER — Emergency Department (HOSPITAL_COMMUNITY)
Admission: EM | Admit: 2012-09-04 | Discharge: 2012-09-04 | Disposition: A | Discharge status: Home / Self Care | Payer: Medicare Other | Attending: Emergency Medicine | Admitting: Emergency Medicine

## 2012-09-04 DIAGNOSIS — R079 Chest pain, unspecified: Secondary | ICD-10-CM

## 2012-09-04 DIAGNOSIS — F419 Anxiety disorder, unspecified: Secondary | ICD-10-CM

## 2012-09-04 LAB — TROPONIN I: Troponin I: <0.3 ng/mL (ref <0.30)

## 2012-09-04 NOTE — ED Provider Notes (Signed)
History     CSN: [id number]  Arrival date & time 09/03/12  2224   First MD Initiated Contact with Patient 09/04/12 0247      Chief Complaint  Patient presents with  . Chest Pain    (Consider location/radiation/quality/duration/timing/severity/associated sxs/prior treatment) HPI Comments: 71 year old male presents emergency department with his wife complaining of sudden onset left-sided chest pain that woke him up from sleep Sunday night. States the pain was sharp, non-radiating and lasted only a second. He went back to sleep up the next morning and went through his day without any problem until 5:30 in the afternoon while he was driving the pain came on again second time it was less intense than when it woke him up from sleep. Has been intermittent since. Denies nausea, vomiting, diaphoresis, shortness of breath, cough. He's a nonsmoker. Denies any family history of early heart disease. 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 If he has been under increased stress. Says he is stressed to the point where will wake him up in the middle of the night needing to complete a task.   Patient is a 71 y.o. male presenting with chest pain. The history is provided by the patient and the spouse.  Chest Pain Pertinent negatives for primary symptoms include no shortness of breath, no cough, no palpitations, no nausea and no dizziness.  Pertinent negatives for associated symptoms include no diaphoresis and no weakness.     Past Medical History  Diagnosis Date  . Hypothyroidism   . BPH (benign prostatic hypertrophy)     History reviewed. No pertinent past surgical history.  Family History  Problem Relation Age of Onset  . Heart disease Mother 91    MI  . Cancer Father 73    lung    History  Substance Use Topics  . Smoking status: Never Smoker   . Smokeless tobacco: Not on file  . Alcohol Use: No       Review of Systems  Constitutional: Negative for diaphoresis, activity change and appetite change.  HENT: Negative for neck pain and neck stiffness.   Respiratory: Negative for cough, chest tightness and shortness of breath.   Cardiovascular: Positive for chest pain. Negative for palpitations and leg swelling.  Gastrointestinal: Negative for nausea.  Musculoskeletal: Negative for back pain.  Skin: Negative for pallor.  Neurological: Negative for dizziness, weakness, light-headedness and headaches.  Psychiatric/Behavioral: Negative for confusion.       Positive for increased stress    Allergies  Review of patient's allergies indicates no known allergies.  Home Medications   Current Outpatient Rx  Name Route Sig Dispense Refill  . ASPIRIN 81 MG PO TBEC Oral Take 81 mg by mouth daily.      Marland Kitchen VITAMIN D3 1000 UNITS PO TABS Oral Take 1,000 Units by mouth daily.      . OMEGA-3 FATTY ACIDS 1000 MG PO CAPS Oral Take 1 g by mouth daily.      Marland Kitchen LEVOTHYROXINE SODIUM 112 MCG PO TABS  TAKE 1 TABLET BY MOUTH DAILY 90 tablet 0  . NAPROXEN SODIUM 220 MG PO TABS Oral Take 220 mg by mouth 2 (two) times daily as needed. For pain    . VITAMIN B-12 1000 MCG PO TABS Oral Take 1,000 mcg by mouth daily.        BP 134/77  Pulse 64  Temp 97.6 F (36.4 C) (Oral)  Resp  18  SpO2 95%  Physical Exam  Constitutional: He is oriented to person, place, and time. He appears well-developed and well-nourished. No distress.  HENT:  Head: Normocephalic and atraumatic.  Mouth/Throat: Oropharynx is clear and moist.  Eyes: Conjunctivae normal and EOM are normal. Pupils are equal, round, and reactive to light.  Neck: Normal range of motion. Neck supple.  Cardiovascular: Normal rate, regular rhythm, normal heart sounds and intact distal pulses.        No extremity edema  Pulmonary/Chest: Effort normal and breath sounds normal. He exhibits no tenderness.  Abdominal: Soft. Bowel sounds are normal. There is no  tenderness.  Musculoskeletal: Normal range of motion.  Neurological: He is alert and oriented to person, place, and time.  Skin: Skin is warm and dry. No rash noted. He is not diaphoretic.  Psychiatric: He has a normal mood and affect. His speech is normal and behavior is normal.    ED Course  Procedures (including critical care time)  Labs Reviewed  CBC WITH DIFFERENTIAL - Abnormal; Notable for the following:    Hemoglobin 12.7 (*)     HCT 37.5 (*)     Neutrophils Relative 42 (*)     All other components within normal limits  COMPREHENSIVE METABOLIC PANEL - Abnormal; Notable for the following:    Glucose, Bld 110 (*)     GFR calc non Af Amer 84 (*)     All other components within normal limits  TROPONIN I   Date: 09/04/2012  Rate: 54  Rhythm: sinus bradycardia  QRS Axis: normal  Intervals: normal  ST/T Wave abnormalities: normal  Conduction Disutrbances:none  Narrative Interpretation: no stemi  Old EKG Reviewed: none available    1. Chest pain   2. Anxiety       MDM  71 y/o male with sudden onset intermittent chest pain. Denies any associated symptoms. Currently pain free. Non smoker. No family hx of heart disease. Exam unremarkable. Troponin negative. CXR showing mild cardiomegaly. Wife states he has been doing heavy lifting and under increased stress. Obtaining 2 hour troponin- if negative, will send home with PCP follow up. Advised him to discuss stress and anxiety with PCP. Case discussed with Dr. Hyacinth Meeker who agrees with plan of care. 4:06 AM Second troponin negative. Patient discharged.       Trevor Mace, PA-C 09/04/12 [phone]

## 2012-09-04 NOTE — ED Notes (Signed)
Pt discharged.Vital signs stable and GCS 15 

## 2012-09-04 NOTE — ED Notes (Signed)
71 y/o male, awoke with sharp CP 24 hours ago - found it difficult to go back to sleep but awoke asymptomatic, then again in the last 10 hours - no radiation, no associated sx.  Has been intermittent, lasts only a second.  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  Physical - has no reproducible pain, no murmurs, clear lungs, no edema.  ECG shows.  Sinus bradycardia with no signs of acute ischemia.  The patient has very atypical pain for either cardiac ischemia or pulmonary embolism, no further testing is required at this time, the patient has 2 sets of negative troponins and can be discharged home to followup with cardiology as outpatient. I discussed the signs with the patient and the spouse and they are agreeable with the plan.  Medical screening examination/treatment/procedure(s) were conducted as a shared visit with non-physician practitioner(s) and myself.  I personally evaluated the patient during the encounter    Vida Roller, MD 09/04/12 [phone]

## 2012-10-16 ENCOUNTER — Other Ambulatory Visit (INDEPENDENT_AMBULATORY_CARE_PROVIDER_SITE_OTHER): Payer: Medicare Other

## 2012-10-16 ENCOUNTER — Ambulatory Visit (INDEPENDENT_AMBULATORY_CARE_PROVIDER_SITE_OTHER): Payer: Medicare Other | Admitting: Internal Medicine

## 2012-10-16 ENCOUNTER — Encounter: Payer: Self-pay | Admitting: Internal Medicine

## 2012-10-16 VITALS — BP 134/90 | HR 76 | Temp 97.4°F | Resp 16 | Ht 73.0 in | Wt 227.0 lb

## 2012-10-16 DIAGNOSIS — Z23 Encounter for immunization: Secondary | ICD-10-CM

## 2012-10-16 DIAGNOSIS — R350 Frequency of micturition: Secondary | ICD-10-CM

## 2012-10-16 DIAGNOSIS — IMO0001 Reserved for inherently not codable concepts without codable children: Secondary | ICD-10-CM

## 2012-10-16 DIAGNOSIS — S060X9A Concussion with loss of consciousness of unspecified duration, initial encounter: Secondary | ICD-10-CM

## 2012-10-16 DIAGNOSIS — E039 Hypothyroidism, unspecified: Secondary | ICD-10-CM

## 2012-10-16 DIAGNOSIS — N400 Enlarged prostate without lower urinary tract symptoms: Secondary | ICD-10-CM

## 2012-10-16 DIAGNOSIS — H11009 Unspecified pterygium of unspecified eye: Secondary | ICD-10-CM

## 2012-10-16 DIAGNOSIS — R202 Paresthesia of skin: Secondary | ICD-10-CM

## 2012-10-16 DIAGNOSIS — R030 Elevated blood-pressure reading, without diagnosis of hypertension: Secondary | ICD-10-CM

## 2012-10-16 DIAGNOSIS — Z0000 Encounter for general adult medical examination without abnormal findings: Secondary | ICD-10-CM

## 2012-10-16 DIAGNOSIS — N320 Bladder-neck obstruction: Secondary | ICD-10-CM

## 2012-10-16 DIAGNOSIS — S060XAA Concussion with loss of consciousness status unknown, initial encounter: Secondary | ICD-10-CM

## 2012-10-16 DIAGNOSIS — Z79899 Other long term (current) drug therapy: Secondary | ICD-10-CM

## 2012-10-16 LAB — HEPATIC FUNCTION PANEL
ALT: 30 U/L (ref 0–53)
Albumin: 4.1 g/dL (ref 3.5–5.2)
Alkaline Phosphatase: 57 U/L (ref 39–117)
Bilirubin, Direct: 0.1 mg/dL (ref 0.0–0.3)
Total Protein: 7.4 g/dL (ref 6.0–8.3)

## 2012-10-16 LAB — URINALYSIS, ROUTINE W REFLEX MICROSCOPIC
Ketones, ur: NEGATIVE
Specific Gravity, Urine: 1.025 (ref 1.000–1.030)
Total Protein, Urine: NEGATIVE
Urine Glucose: NEGATIVE

## 2012-10-16 LAB — CBC WITH DIFFERENTIAL/PLATELET
Basophils Relative: 1.3 % (ref 0.0–3.0)
Eosinophils Relative: 6.1 % — ABNORMAL HIGH (ref 0.0–5.0)
Lymphocytes Relative: 37.9 % (ref 12.0–46.0)
MCV: 87.9 fl (ref 78.0–100.0)
Monocytes Absolute: 0.6 10*3/uL (ref 0.1–1.0)
Monocytes Relative: 16.9 % — ABNORMAL HIGH (ref 3.0–12.0)
Neutrophils Relative %: 37.8 % — ABNORMAL LOW (ref 43.0–77.0)
Platelets: 161 10*3/uL (ref 150.0–400.0)
RBC: 4.6 Mil/uL (ref 4.22–5.81)
WBC: 3.3 10*3/uL — ABNORMAL LOW (ref 4.5–10.5)

## 2012-10-16 LAB — LIPID PANEL
Cholesterol: 123 mg/dL (ref 0–200)
LDL Cholesterol: 75 mg/dL (ref 0–99)
Triglycerides: 86 mg/dL (ref 0.0–149.0)
VLDL: 17.2 mg/dL (ref 0.0–40.0)

## 2012-10-16 LAB — BASIC METABOLIC PANEL
BUN: 16 mg/dL (ref 6–23)
Calcium: 9.1 mg/dL (ref 8.4–10.5)
Chloride: 102 mEq/L (ref 96–112)
Creatinine, Ser: 1 mg/dL (ref 0.4–1.5)
GFR: 99.87 mL/min (ref >60.00)

## 2012-10-16 LAB — HEMOGLOBIN A1C: Hgb A1c MFr Bld: 6.2 % (ref 4.6–6.5)

## 2012-10-16 LAB — TSH: TSH: 0.8 u[IU]/mL (ref 0.35–5.50)

## 2012-10-16 MED ORDER — TERAZOSIN HCL 2 MG PO CAPS: 2.0000 mg | ORAL_CAPSULE | Freq: Every day | ORAL | Status: DC

## 2012-10-16 NOTE — Assessment & Plan Note (Signed)
Discussed - no HAs now

## 2012-10-16 NOTE — Assessment & Plan Note (Signed)
Labs - start Hytrin

## 2012-10-16 NOTE — Assessment & Plan Note (Signed)
Labs A1c 

## 2012-10-16 NOTE — Assessment & Plan Note (Signed)
labs

## 2012-10-16 NOTE — Assessment & Plan Note (Signed)
Start Hytrin 

## 2012-10-16 NOTE — Progress Notes (Signed)
Subjective:    Patient ID: Blake Zamora, male    DOB: Apr 18, 1941, 71 y.o.   MRN: [id number]  HPI  The patient is here for a wellness exam. The patient has been doing well overall without major physical or psychological issues going on lately. The patient needs to address chronic BPH that has not been well controlled with medicines  Wt Readings from Last 3 Encounters:  10/16/12 227 lb (102.967 kg)  10/03/11 230 lb (104.327 kg)  04/07/11 227 lb (102.967 kg)   BP Readings from Last 3 Encounters:  10/16/12 134/90  09/04/12 134/77  10/03/11 140/80       Review of Systems  Constitutional: Negative for appetite change, fatigue and unexpected weight change.  HENT: Positive for hearing loss (mild). Negative for nosebleeds, congestion, sore throat, sneezing, trouble swallowing and neck pain.   Eyes: Negative for itching and visual disturbance.  Respiratory: Negative for cough.   Cardiovascular: Negative for chest pain, palpitations and leg swelling.  Gastrointestinal: Negative for nausea, diarrhea, blood in stool and abdominal distention.  Genitourinary: Positive for urgency, frequency and decreased urine volume. Negative for hematuria.  Musculoskeletal: Negative for back pain, joint swelling and gait problem.  Skin: Negative for rash.  Neurological: Negative for dizziness, tremors, speech difficulty and weakness.  Psychiatric/Behavioral: Negative for suicidal ideas, sleep disturbance, dysphoric mood and agitation. The patient is not nervous/anxious.        Objective:   Physical Exam  Constitutional: He is oriented to person, place, and time. He appears well-developed and well-nourished. No distress.  HENT:  Head: Normocephalic and atraumatic.  Right Ear: External ear normal.  Left Ear: External ear normal.  Nose: Nose normal.  Mouth/Throat: Oropharynx is clear and moist. No oropharyngeal exudate.       R eye pterygium  Eyes: Conjunctivae normal and EOM are normal. Pupils are  equal, round, and reactive to light. Right eye exhibits no discharge. Left eye exhibits no discharge. No scleral icterus.  Neck: Normal range of motion. Neck supple. No JVD present. No tracheal deviation present. No thyromegaly present.  Cardiovascular: Normal rate, regular rhythm, normal heart sounds and intact distal pulses.  Exam reveals no gallop and no friction rub.   No murmur heard. Pulmonary/Chest: Effort normal and breath sounds normal. No stridor. No respiratory distress. He has no wheezes. He has no rales. He exhibits no tenderness.  Abdominal: Soft. Bowel sounds are normal. He exhibits no distension and no mass. There is no tenderness. There is no rebound and no guarding.  Genitourinary: Rectum normal and penis normal. Guaiac negative stool. No penile tenderness.       Prostate is 2+  Musculoskeletal: Normal range of motion. He exhibits no edema and no tenderness.  Lymphadenopathy:    He has no cervical adenopathy.  Neurological: He is alert and oriented to person, place, and time. He has normal reflexes. No cranial nerve deficit. He exhibits normal muscle tone. Coordination normal.  Skin: Skin is warm and dry. No rash noted. He is not diaphoretic. No erythema. No pallor.  Psychiatric: He has a normal mood and affect. His behavior is normal. Judgment and thought content normal.    Lab Results  Component Value Date   WBC 5.0 09/03/2012   HGB 12.7* 09/03/2012   HCT 37.5* 09/03/2012   PLT 163 09/03/2012   GLUCOSE 110* 09/03/2012   CHOL 140 10/03/2011   TRIG 110.0 10/03/2011   HDL 32.40* 10/03/2011   LDLCALC 86 10/03/2011   ALT 23 09/03/2012  AST 27 09/03/2012   NA 140 09/03/2012   K 3.8 09/03/2012   CL 105 09/03/2012   CREATININE 0.93 09/03/2012   BUN 18 09/03/2012   CO2 26 09/03/2012   TSH 1.21 10/03/2011   PSA 0.77 10/03/2011         Assessment & Plan:

## 2012-10-16 NOTE — Assessment & Plan Note (Signed)
The patient is here for annual Medicare wellness examination and management of other chronic and acute problems.   The risk factors are reflected in the social history.  The roster of all physicians providing medical care to patient - is listed in the Snapshot section of the chart.  Activities of daily living:  The patient is 100% inedpendent in all ADLs: dressing, toileting, feeding as well as independent mobility  Home safety : The patient has smoke detectors in the home. They wear seatbelts.No firearms at home ( firearms are present in the home, kept in a safe fashion). There is no violence in the home.   There is no risks for hepatitis, STDs or HIV. There is no   history of blood transfusion. They have no travel history to infectious disease endemic areas of the world.  The patient has (has not) seen their dentist in the last six month. They have (not) seen their eye doctor in the last year. They deny (admit to) any hearing difficulty and have not had audiologic testing in the last year.  They do not  have excessive sun exposure. Discussed the need for sun protection: hats, long sleeves and use of sunscreen if there is significant sun exposure.   Diet: the importance of a healthy diet is discussed. They do have a healthy (unhealthy-high fat/fast food) diet.  The patient has a regular exercise program: _______ , ____duration, __3___per week.  The benefits of regular aerobic exercise were discussed.  Depression screen: there are no signs or vegative symptoms of depression- irritability, change in appetite, anhedonia, sadness/tearfullness.  Cognitive assessment: the patient manages all their financial and personal affairs and is actively engaged. They could relate day,date,year and events; recalled 3/3 objects at 3 minutes; performed clock-face test normally.  The following portions of the patient's history were reviewed and updated as appropriate: allergies, current medications, past family  history, past medical history,  past surgical history, past social history  and problem list.  Vision, hearing, body mass index were assessed and reviewed.   During the course of the visit the patient was educated and counseled about appropriate screening and preventive services including : fall prevention , diabetes screening, nutrition counseling, colorectal cancer screening, and recommended immunizations.

## 2012-11-24 ENCOUNTER — Other Ambulatory Visit: Payer: Self-pay | Admitting: Internal Medicine

## 2013-05-16 ENCOUNTER — Other Ambulatory Visit: Payer: Self-pay | Admitting: Internal Medicine

## 2013-11-05 ENCOUNTER — Other Ambulatory Visit: Payer: Self-pay | Admitting: Internal Medicine

## 2013-11-28 ENCOUNTER — Ambulatory Visit (INDEPENDENT_AMBULATORY_CARE_PROVIDER_SITE_OTHER): Payer: Medicare Other | Admitting: Internal Medicine

## 2013-11-28 ENCOUNTER — Encounter: Payer: Self-pay | Admitting: Internal Medicine

## 2013-11-28 ENCOUNTER — Other Ambulatory Visit (INDEPENDENT_AMBULATORY_CARE_PROVIDER_SITE_OTHER): Payer: Medicare Other

## 2013-11-28 VITALS — BP 132/78 | HR 53 | Temp 98.4°F | Ht 72.25 in | Wt 231.5 lb

## 2013-11-28 DIAGNOSIS — R030 Elevated blood-pressure reading, without diagnosis of hypertension: Secondary | ICD-10-CM

## 2013-11-28 DIAGNOSIS — M545 Low back pain, unspecified: Secondary | ICD-10-CM

## 2013-11-28 DIAGNOSIS — R7309 Other abnormal glucose: Secondary | ICD-10-CM

## 2013-11-28 DIAGNOSIS — H11001 Unspecified pterygium of right eye: Secondary | ICD-10-CM

## 2013-11-28 DIAGNOSIS — Z23 Encounter for immunization: Secondary | ICD-10-CM

## 2013-11-28 DIAGNOSIS — E039 Hypothyroidism, unspecified: Secondary | ICD-10-CM

## 2013-11-28 DIAGNOSIS — N320 Bladder-neck obstruction: Secondary | ICD-10-CM

## 2013-11-28 DIAGNOSIS — H11009 Unspecified pterygium of unspecified eye: Secondary | ICD-10-CM

## 2013-11-28 DIAGNOSIS — R739 Hyperglycemia, unspecified: Secondary | ICD-10-CM

## 2013-11-28 DIAGNOSIS — Z0000 Encounter for general adult medical examination without abnormal findings: Secondary | ICD-10-CM

## 2013-11-28 DIAGNOSIS — IMO0001 Reserved for inherently not codable concepts without codable children: Secondary | ICD-10-CM

## 2013-11-28 LAB — CBC WITH DIFFERENTIAL/PLATELET
BASOS ABS: 0 10*3/uL (ref 0.0–0.1)
Basophils Relative: 0.4 % (ref 0.0–3.0)
EOS ABS: 0.2 10*3/uL (ref 0.0–0.7)
Eosinophils Relative: 5.8 % — ABNORMAL HIGH (ref 0.0–5.0)
HCT: 38.3 % — ABNORMAL LOW (ref 39.0–52.0)
Hemoglobin: 12.8 g/dL — ABNORMAL LOW (ref 13.0–17.0)
LYMPHS PCT: 32.8 % (ref 12.0–46.0)
Lymphs Abs: 1.3 10*3/uL (ref 0.7–4.0)
MCHC: 33.4 g/dL (ref 30.0–36.0)
MCV: 85.7 fl (ref 78.0–100.0)
MONO ABS: 0.6 10*3/uL (ref 0.1–1.0)
Monocytes Relative: 14.7 % — ABNORMAL HIGH (ref 3.0–12.0)
NEUTROS PCT: 46.3 % (ref 43.0–77.0)
Neutro Abs: 1.8 10*3/uL (ref 1.4–7.7)
PLATELETS: 176 10*3/uL (ref 150.0–400.0)
RBC: 4.47 Mil/uL (ref 4.22–5.81)
RDW: 14.9 % — AB (ref 11.5–14.6)
WBC: 3.9 10*3/uL — ABNORMAL LOW (ref 4.5–10.5)

## 2013-11-28 LAB — HEPATIC FUNCTION PANEL
ALT: 24 U/L (ref 0–53)
AST: 23 U/L (ref 0–37)
Albumin: 4.2 g/dL (ref 3.5–5.2)
Alkaline Phosphatase: 53 U/L (ref 39–117)
BILIRUBIN DIRECT: 0.1 mg/dL (ref 0.0–0.3)
BILIRUBIN TOTAL: 0.6 mg/dL (ref 0.3–1.2)
Total Protein: 7.3 g/dL (ref 6.0–8.3)

## 2013-11-28 LAB — URINALYSIS
BILIRUBIN URINE: NEGATIVE
Ketones, ur: NEGATIVE
LEUKOCYTES UA: NEGATIVE
NITRITE: NEGATIVE
PH: 6 (ref 5.0–8.0)
Specific Gravity, Urine: >=1.03 — AB (ref 1.000–1.030)
TOTAL PROTEIN, URINE-UPE24: NEGATIVE
Urine Glucose: NEGATIVE
Urobilinogen, UA: 0.2 (ref 0.0–1.0)

## 2013-11-28 LAB — BASIC METABOLIC PANEL
BUN: 16 mg/dL (ref 6–23)
CALCIUM: 8.9 mg/dL (ref 8.4–10.5)
CO2: 29 meq/L (ref 19–32)
CREATININE: 0.9 mg/dL (ref 0.4–1.5)
Chloride: 107 mEq/L (ref 96–112)
GFR: 104.55 mL/min (ref >60.00)
GLUCOSE: 104 mg/dL — AB (ref 70–99)
Potassium: 4.1 mEq/L (ref 3.5–5.1)
Sodium: 141 mEq/L (ref 135–145)

## 2013-11-28 LAB — HEMOGLOBIN A1C: Hgb A1c MFr Bld: 6.5 % (ref 4.6–6.5)

## 2013-11-28 LAB — TSH: TSH: 0.63 u[IU]/mL (ref 0.35–5.50)

## 2013-11-28 MED ORDER — TERAZOSIN HCL 2 MG PO CAPS: 2.0000 mg | ORAL_CAPSULE | Freq: Every day | ORAL | Status: DC

## 2013-11-28 NOTE — Assessment & Plan Note (Signed)
Continue with current prescription therapy as reflected on the Med list.  

## 2013-11-28 NOTE — Assessment & Plan Note (Signed)
BP Readings from Last 3 Encounters:  11/28/13 132/78  10/16/12 134/90  09/04/12 134/77

## 2013-11-28 NOTE — Assessment & Plan Note (Signed)
He had an eye exam 12/1

## 2013-11-28 NOTE — Progress Notes (Signed)
Pre-visit discussion using our clinic review tool. No additional management support is needed unless otherwise documented below in the visit note.  

## 2013-11-28 NOTE — Progress Notes (Signed)
Subjective:    HPI  The patient is here for a wellness exam. The patient has been doing well overall without major physical or psychological issues going on lately, except for a LBP off and on. 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  HENT: Positive for hearing loss (mild). Negative for congestion, nosebleeds, sneezing, sore throat and trouble swallowing.   Eyes: Negative for itching and visual disturbance.  Respiratory: Negative for cough.   Cardiovascular: Negative for chest pain, palpitations and leg swelling.  Gastrointestinal: Negative for nausea, diarrhea, blood in stool and abdominal distention.  Genitourinary: Positive for urgency, frequency and decreased urine volume. Negative for hematuria.  Musculoskeletal: Negative for back pain, gait problem, joint swelling and neck pain.  Skin: Negative for rash.  Neurological: Negative for dizziness, tremors, speech difficulty and weakness.  Psychiatric/Behavioral: Negative for suicidal ideas, sleep disturbance, dysphoric mood and agitation. The patient is not nervous/anxious.        Objective:   Physical Exam  Constitutional: He is oriented to person, place, and time. He appears well-developed and well-nourished. No distress.  HENT:  Head: Normocephalic and atraumatic.  Right Ear: External ear normal.  Left Ear: External ear normal.  Nose: Nose normal.  Mouth/Throat: Oropharynx is clear and moist. No oropharyngeal exudate.  R eye pterygium  Eyes: Conjunctivae and EOM are normal. Pupils are equal, round, and reactive to  light. Right eye exhibits no discharge. Left eye exhibits no discharge. No scleral icterus.  Neck: Normal range of motion. Neck supple. No JVD present. No tracheal deviation present. No thyromegaly present.  Cardiovascular: Normal rate, regular rhythm, normal heart sounds and intact distal pulses.  Exam reveals no gallop and no friction rub.   No murmur heard. Pulmonary/Chest: Effort normal and breath sounds normal. No stridor. No respiratory distress. He has no wheezes. He has no rales. He exhibits no tenderness.  Abdominal: Soft. Bowel sounds are normal. He exhibits no distension and no mass. There is no tenderness. There is no rebound and no guarding.  Genitourinary: Rectum normal and penis normal. Guaiac negative stool. No penile tenderness.  Prostate is 2+  Musculoskeletal: Normal range of motion. He exhibits no edema and no tenderness.  Lymphadenopathy:    He has no cervical adenopathy.  Neurological: He is alert and oriented to person, place, and time. He has normal reflexes. No cranial nerve deficit. He exhibits normal muscle tone. Coordination normal.  Skin: Skin is warm and dry. No rash noted. He is not diaphoretic. No erythema. No pallor.  Psychiatric: He has a normal mood and affect. His behavior is normal. Judgment and thought content normal.  LS is tender  Lab Results  Component Value Date   WBC 3.3* 10/16/2012   HGB 13.3 10/16/2012   HCT 40.4 10/16/2012   PLT 161.0 10/16/2012   GLUCOSE 95 10/16/2012   CHOL 123 10/16/2012   TRIG 86.0 10/16/2012   HDL 31.10* 10/16/2012   LDLCALC 75 10/16/2012   ALT 30 10/16/2012   AST 28 10/16/2012   NA 136 10/16/2012  K 4.4 10/16/2012   CL 102 10/16/2012   CREATININE 1.0 10/16/2012   BUN 16 10/16/2012   CO2 29 10/16/2012   TSH 0.80 10/16/2012   PSA 0.70 10/16/2012   HGBA1C 6.2 10/16/2012         Assessment & Plan:

## 2013-11-28 NOTE — Assessment & Plan Note (Addendum)
2014 recurrent [facility] Exercises given

## 2013-11-28 NOTE — Assessment & Plan Note (Signed)

## 2013-12-18 ENCOUNTER — Other Ambulatory Visit: Payer: Self-pay | Admitting: Internal Medicine

## 2014-01-04 ENCOUNTER — Other Ambulatory Visit (HOSPITAL_COMMUNITY): Payer: Self-pay | Admitting: *Deleted

## 2014-01-04 NOTE — Pre-Procedure Instructions (Signed)
[place] - Preparing for Surgery  Before surgery, you can play an important role.  Because skin is not sterile, your skin needs to be as free of germs as possible.  You can reduce the number of germs on you skin by washing with CHG (chlorahexidine gluconate) soap before surgery.  CHG is an antiseptic cleaner which kills germs and bonds with the skin to continue killing germs even after washing.  Please DO NOT use if you have an allergy to CHG or antibacterial soaps.  If your skin becomes reddened/irritated stop using the CHG and inform your nurse when you arrive at Short Stay.  Do not shave (including legs and underarms) for at least 48 hours prior to the first CHG shower.  You may shave your face.  Please follow these instructions carefully:   1.  Shower with CHG Soap the night before surgery and the morning of Surgery.  2.  If you choose to wash your hair, wash your hair first as usual with your normal shampoo.  3.  After you shampoo, rinse your hair and body thoroughly to remove the shampoo.  4.  Use CHG as you would any other liquid soap.  You can apply CHG directly to the skin and wash gently with scrungie or a clean washcloth.  5.  Apply the CHG Soap to your body ONLY FROM THE NECK DOWN.  Do not use on open wounds or open sores.  Avoid contact with your eyes, ears, mouth and genitals (private parts).  Wash genitals (private parts) with your normal soap.  6.  Wash thoroughly, paying special attention to the area where your surgery will be performed.  7.  Thoroughly rinse your body with warm water from the neck down.  8.  DO NOT shower/wash with your normal soap after using and rinsing off the CHG Soap.  9.  Pat yourself dry with a clean towel.            10.  Wear clean pajamas.            11.  Place clean sheets on your bed the night of your first shower and do not sleep with pets.  Day of Surgery  Do not apply any lotions the morning of surgery.  Please wear clean clothes to the  hospital/surgery center.   

## 2014-01-06 ENCOUNTER — Encounter (HOSPITAL_COMMUNITY): Payer: Self-pay

## 2014-01-06 ENCOUNTER — Encounter (HOSPITAL_COMMUNITY)
Admission: RE | Admit: 2014-01-06 | Discharge: 2014-01-06 | Disposition: A | Discharge status: Home / Self Care | Payer: Medicare Other | Source: Ambulatory Visit | Attending: Ophthalmology | Admitting: Ophthalmology

## 2014-01-06 ENCOUNTER — Encounter (HOSPITAL_COMMUNITY): Payer: Self-pay | Admitting: Pharmacy Technician

## 2014-01-06 ENCOUNTER — Ambulatory Visit (HOSPITAL_COMMUNITY)
Admission: RE | Admit: 2014-01-06 | Discharge: 2014-01-06 | Disposition: A | Discharge status: Home / Self Care | Payer: Medicare Other | Source: Ambulatory Visit | Attending: Ophthalmology | Admitting: Ophthalmology

## 2014-01-06 DIAGNOSIS — Z01810 Encounter for preprocedural cardiovascular examination: Secondary | ICD-10-CM | POA: Insufficient documentation

## 2014-01-06 DIAGNOSIS — Z01812 Encounter for preprocedural laboratory examination: Secondary | ICD-10-CM | POA: Insufficient documentation

## 2014-01-06 DIAGNOSIS — Z01818 Encounter for other preprocedural examination: Secondary | ICD-10-CM | POA: Insufficient documentation

## 2014-01-06 HISTORY — DX: Effusion, unspecified knee: M25.469

## 2014-01-06 HISTORY — DX: Anxiety disorder, unspecified: F41.9

## 2014-01-06 LAB — CBC
HCT: 38.2 % — ABNORMAL LOW (ref 39.0–52.0)
Hemoglobin: 12.8 g/dL — ABNORMAL LOW (ref 13.0–17.0)
MCH: 29.2 pg (ref 26.0–34.0)
MCHC: 33.5 g/dL (ref 30.0–36.0)
MCV: 87 fL (ref 78.0–100.0)
PLATELETS: 147 10*3/uL — AB (ref 150–400)
RBC: 4.39 MIL/uL (ref 4.22–5.81)
RDW: 13.9 % (ref 11.5–15.5)
WBC: 4.5 10*3/uL (ref 4.0–10.5)

## 2014-01-06 LAB — COMPREHENSIVE METABOLIC PANEL
ALBUMIN: 3.8 g/dL (ref 3.5–5.2)
ALT: 22 U/L (ref 0–53)
AST: 21 U/L (ref 0–37)
Alkaline Phosphatase: 66 U/L (ref 39–117)
BUN: 15 mg/dL (ref 6–23)
CO2: 25 meq/L (ref 19–32)
CREATININE: 0.97 mg/dL (ref 0.50–1.35)
Calcium: 9.4 mg/dL (ref 8.4–10.5)
Chloride: 103 mEq/L (ref 96–112)
GFR calc Af Amer: >90 mL/min (ref >90)
GFR, EST NON AFRICAN AMERICAN: 82 mL/min — AB (ref >90)
Glucose, Bld: 107 mg/dL — ABNORMAL HIGH (ref 70–99)
Potassium: 4.2 mEq/L (ref 3.7–5.3)
Sodium: 141 mEq/L (ref 137–147)
Total Bilirubin: 0.5 mg/dL (ref 0.3–1.2)
Total Protein: 7.3 g/dL (ref 6.0–8.3)

## 2014-01-06 NOTE — Pre-Procedure Instructions (Signed)
Blake LandauJames D Zamora  01/06/2014   Your procedure is scheduled on:  01/10/14  [facility]  2 * 3 at 530 AM.  Call this number if you have problems the morning of surgery: [phone]   Remember:   Do not eat food or drink liquids after midnight.   Take these medicines the morning of surgery with A SIP OF WATER: synthroid   Do not wear jewelry, make-up or nail polish.  Do not wear lotions, powders, or perfumes. You may wear deodorant.  Do not shave 48 hours prior to surgery. Men may shave face and neck.  Do not bring valuables to the hospital.  [name] is not responsible                  for any belongings or valuables.               Contacts, dentures or bridgework may not be worn into surgery.  Leave suitcase in the car. After surgery it may be brought to your room.  For patients admitted to the hospital, discharge time is determined by your                treatment team.               Patients discharged the day of surgery will not be allowed to drive  home.  Name and phone number of your driver:   Special Instructions: Shower using CHG 2 nights before surgery and the night before surgery.  If you shower the day of surgery use CHG.  Use special wash - you have one bottle of CHG for all showers.  You should use approximately 1/3 of the bottle for each shower.   Please read over the following fact sheets that you were given: Pain Booklet, Coughing and Deep Breathing and Surgical Site Infection Prevention

## 2014-01-06 NOTE — Progress Notes (Signed)
Anesthesia PAT Evaluation:  Patient is a 73 year old male scheduled for pterygium excision right eye with amniotic graft with tissue glue Mitomycin C, right eye on 01/10/14 by Dr. [name].  Procedure is posted for MAC; however, he has some concerns about this--first, he is sensitive about anything touching his eye and would prefer not to be aware of what is being done during surgery.  Secondly, he has back pain and is concerned about being on his back for the posted 2+ hours.    History includes non-smoker, hypothyroidism, BPH, back surgery '92 for ruptured disc.  BMI is 31.40. PCP is Dr. [name] with [facility] 11/28/13.   EKG on 01/06/14 showed SB @ 59 bpm, occasional PACs, T wave abnormality, consider lateral ischemia.   He has had a lateral T wave abnormality dating back to at least 11/09/08, although more pronounced on his EKG today.  He denies history of known CAD, MI, CHF, or DM.  He denied chest pain and SOB.  He considers himself fairly active--but not quite as much since 09/2013 due to right knee effusion and back pain.  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  Exam show a pleasant male, mildly obese with BMI 31.40.  Heart RRR, no murmur noted.  Lungs clear.  I did not appreciate any carotid bruits.  No significant LE edema (sligth on RLE).    CXR on 01/06/14 showed no active lung disease.  Preoperative labs noted.   I reviewed above with anesthesiologist Dr. [name]--including his anesthesia concerns and abnormal EKG.  General anesthesia can likely be considered from an anesthesia prospective, but it would also depend if that was okay with Dr. [name].  Patient is aware of this.  He understands that he can talk more about this with Dr. [name] and his assigned anesthesiologist on the day of surgery.  Blake Ochsllison Sacheen Arrasmith, PA-C [facility] Short Stay [facility]/Anesthesiology Phone [phone] 01/06/2014 4:54 PM

## 2014-01-06 NOTE — Progress Notes (Signed)
Pt requests to speek with PA regarding anesthesia.

## 2014-01-09 NOTE — Anesthesia Preprocedure Evaluation (Addendum)
Anesthesia Evaluation  Patient identified by MRN, date of birth, ID band Patient awake    Reviewed: Allergy & Precautions, H&P , NPO status , Patient's Chart, lab work & pertinent test results  Airway Mallampati: II TM Distance: <3 FB Neck ROM: Full    Dental  (+) Caps, Partial Lower, Dental Advisory Given,    Pulmonary neg pulmonary ROS,  01-06-14 Chest x-ray IMPRESSION: Stable chest x-ray.  No active lung disease.   Pulmonary exam normal       Cardiovascular Exercise Tolerance: Good hypertension, Pt. on medications Rhythm:Regular Rate:Normal  01-06-14 EKG 06-Jan-2014 Sinus bradycardia with Premature atrial complexes T wave abnormality, consider lateral ischemia Abnormal ECG No significant change since last tracing   Neuro/Psych Anxiety negative neurological ROS     GI/Hepatic negative GI ROS, Neg liver ROS,   Endo/Other  Hypothyroidism   Renal/GU negative Renal ROS   BPH    Musculoskeletal negative musculoskeletal ROS (+)   Abdominal Normal abdominal exam  (+)   Peds  Hematology negative hematology ROS (+)   Anesthesia Other Findings   Reproductive/Obstetrics                      Anesthesia Physical Anesthesia Plan  ASA: II  Anesthesia Plan: MAC   Post-op Pain Management:    Induction: Intravenous  Airway Management Planned: Nasal Cannula  Additional Equipment:   Intra-op Plan:   Post-operative Plan:   Informed Consent:   Dental advisory given  Plan Discussed with: CRNA and Anesthesiologist  Anesthesia Plan Comments:        Anesthesia Quick Evaluation

## 2014-01-10 ENCOUNTER — Encounter (HOSPITAL_COMMUNITY): Payer: Medicare Other | Admitting: Vascular Surgery

## 2014-01-10 ENCOUNTER — Encounter (HOSPITAL_COMMUNITY): Payer: Self-pay | Admitting: *Deleted

## 2014-01-10 ENCOUNTER — Ambulatory Visit (HOSPITAL_COMMUNITY)
Admission: RE | Admit: 2014-01-10 | Discharge: 2014-01-10 | Disposition: A | Discharge status: Home / Self Care | Payer: Medicare Other | Source: Ambulatory Visit | Attending: Ophthalmology | Admitting: Ophthalmology

## 2014-01-10 ENCOUNTER — Encounter (HOSPITAL_COMMUNITY)
Admission: RE | Disposition: A | Discharge status: Home / Self Care | Payer: Self-pay | Source: Ambulatory Visit | Attending: Ophthalmology

## 2014-01-10 ENCOUNTER — Ambulatory Visit (HOSPITAL_COMMUNITY): Payer: Medicare Other | Admitting: Anesthesiology

## 2014-01-10 DIAGNOSIS — I10 Essential (primary) hypertension: Secondary | ICD-10-CM | POA: Insufficient documentation

## 2014-01-10 DIAGNOSIS — F411 Generalized anxiety disorder: Secondary | ICD-10-CM | POA: Insufficient documentation

## 2014-01-10 DIAGNOSIS — M25469 Effusion, unspecified knee: Secondary | ICD-10-CM | POA: Insufficient documentation

## 2014-01-10 DIAGNOSIS — Z7982 Long term (current) use of aspirin: Secondary | ICD-10-CM | POA: Insufficient documentation

## 2014-01-10 DIAGNOSIS — E039 Hypothyroidism, unspecified: Secondary | ICD-10-CM | POA: Insufficient documentation

## 2014-01-10 DIAGNOSIS — H11009 Unspecified pterygium of unspecified eye: Secondary | ICD-10-CM | POA: Insufficient documentation

## 2014-01-10 DIAGNOSIS — N400 Enlarged prostate without lower urinary tract symptoms: Secondary | ICD-10-CM | POA: Insufficient documentation

## 2014-01-10 HISTORY — PX: PTERYGIUM EXCISION: SHX2273

## 2014-01-10 HISTORY — PX: MITOMYCIN C APPLICATION: SHX6375

## 2014-01-10 SURGERY — EXCISION, PTERYGIUM
Anesthesia: Monitor Anesthesia Care | Site: Eye | Laterality: Right

## 2014-01-10 MED ORDER — EVICEL 2 ML EX KIT
PACK | CUTANEOUS | Status: DC | PRN
  Administered 2014-01-10: 1

## 2014-01-10 MED ORDER — EVICEL 2 ML EX KIT
PACK | CUTANEOUS | Status: AC
  Filled 2014-01-10: qty 1

## 2014-01-10 MED ORDER — TETRACAINE HCL 0.5 % OP SOLN
OPHTHALMIC | Status: AC
  Filled 2014-01-10: qty 2

## 2014-01-10 MED ORDER — SUCCINYLCHOLINE CHLORIDE 20 MG/ML IJ SOLN
INTRAMUSCULAR | Status: AC
  Filled 2014-01-10: qty 1

## 2014-01-10 MED ORDER — PROPOFOL 10 MG/ML IV BOLUS
INTRAVENOUS | Status: DC | PRN
  Administered 2014-01-10: 50 mg via INTRAVENOUS

## 2014-01-10 MED ORDER — TOBRAMYCIN-DEXAMETHASONE 0.3-0.1 % OP OINT
TOPICAL_OINTMENT | OPHTHALMIC | Status: AC
  Filled 2014-01-10: qty 3.5

## 2014-01-10 MED ORDER — FENTANYL CITRATE 0.05 MG/ML IJ SOLN
INTRAMUSCULAR | Status: AC
  Filled 2014-01-10: qty 5

## 2014-01-10 MED ORDER — BSS IO SOLN
INTRAOCULAR | Status: AC
  Filled 2014-01-10: qty 15

## 2014-01-10 MED ORDER — ONDANSETRON HCL 4 MG/2ML IJ SOLN
INTRAMUSCULAR | Status: DC | PRN
  Administered 2014-01-10: 4 mg via INTRAVENOUS

## 2014-01-10 MED ORDER — LIDOCAINE-EPINEPHRINE 2 %-1:100000 IJ SOLN
INTRAMUSCULAR | Status: DC | PRN
  Administered 2014-01-10: 07:00:00 via RETROBULBAR

## 2014-01-10 MED ORDER — PROPOFOL INFUSION 10 MG/ML OPTIME
INTRAVENOUS | Status: DC | PRN
  Administered 2014-01-10: 25 ug/kg/min via INTRAVENOUS

## 2014-01-10 MED ORDER — HYALURONIDASE HUMAN 150 UNIT/ML IJ SOLN
INTRAMUSCULAR | Status: AC
  Filled 2014-01-10: qty 1

## 2014-01-10 MED ORDER — 0.9 % SODIUM CHLORIDE (POUR BTL) OPTIME
TOPICAL | Status: DC | PRN
  Administered 2014-01-10: 1000 mL

## 2014-01-10 MED ORDER — ARTIFICIAL TEARS OP OINT
TOPICAL_OINTMENT | OPHTHALMIC | Status: AC
  Filled 2014-01-10: qty 3.5

## 2014-01-10 MED ORDER — LIDOCAINE-EPINEPHRINE 2 %-1:100000 IJ SOLN
INTRAMUSCULAR | Status: AC
  Filled 2014-01-10: qty 1

## 2014-01-10 MED ORDER — BSS IO SOLN
INTRAOCULAR | Status: DC | PRN
  Administered 2014-01-10: 500 mL via INTRAOCULAR

## 2014-01-10 MED ORDER — EPHEDRINE SULFATE 50 MG/ML IJ SOLN
INTRAMUSCULAR | Status: AC
  Filled 2014-01-10: qty 1

## 2014-01-10 MED ORDER — FENTANYL CITRATE 0.05 MG/ML IJ SOLN: 25.0000 ug | INTRAMUSCULAR | Status: DC | PRN

## 2014-01-10 MED ORDER — FENTANYL CITRATE 0.05 MG/ML IJ SOLN
INTRAMUSCULAR | Status: DC | PRN
  Administered 2014-01-10 (×2): 50 ug via INTRAVENOUS

## 2014-01-10 MED ORDER — ATROPINE SULFATE 0.1 MG/ML IJ SOLN
INTRAMUSCULAR | Status: AC
  Filled 2014-01-10: qty 10

## 2014-01-10 MED ORDER — ATROPINE SULFATE 1 % OP SOLN
OPHTHALMIC | Status: AC
  Filled 2014-01-10: qty 2

## 2014-01-10 MED ORDER — BUPIVACAINE HCL (PF) 0.25 % IJ SOLN
INTRAMUSCULAR | Status: AC
  Filled 2014-01-10: qty 30

## 2014-01-10 MED ORDER — BUPIVACAINE HCL (PF) 0.75 % IJ SOLN
INTRAMUSCULAR | Status: AC
  Filled 2014-01-10: qty 10

## 2014-01-10 MED ORDER — LIDOCAINE HCL 2 % IJ SOLN
INTRAMUSCULAR | Status: AC
  Filled 2014-01-10: qty 20

## 2014-01-10 MED ORDER — MIDAZOLAM HCL 5 MG/5ML IJ SOLN
INTRAMUSCULAR | Status: DC | PRN
  Administered 2014-01-10 (×2): 1 mg via INTRAVENOUS

## 2014-01-10 MED ORDER — LIDOCAINE HCL (CARDIAC) 20 MG/ML IV SOLN
INTRAVENOUS | Status: AC
  Filled 2014-01-10: qty 5

## 2014-01-10 MED ORDER — PILOCARPINE HCL 4 % OP SOLN
OPHTHALMIC | Status: AC
  Filled 2014-01-10: qty 15

## 2014-01-10 MED ORDER — MITOMYCIN 0.2 MG OP KIT
PACK | OPHTHALMIC | Status: DC | PRN
  Administered 2014-01-10: .4 mg via OPHTHALMIC

## 2014-01-10 MED ORDER — LACTATED RINGERS IV SOLN
INTRAVENOUS | Status: DC | PRN
  Administered 2014-01-10: 07:00:00 via INTRAVENOUS

## 2014-01-10 MED ORDER — PROPOFOL 10 MG/ML IV BOLUS
INTRAVENOUS | Status: AC
  Filled 2014-01-10: qty 20

## 2014-01-10 MED ORDER — ROCURONIUM BROMIDE 50 MG/5ML IV SOLN
INTRAVENOUS | Status: AC
  Filled 2014-01-10: qty 1

## 2014-01-10 MED ORDER — BSS IO SOLN
INTRAOCULAR | Status: DC | PRN
  Administered 2014-01-10: 15 mL via INTRAOCULAR

## 2014-01-10 MED ORDER — MIDAZOLAM HCL 2 MG/2ML IJ SOLN
INTRAMUSCULAR | Status: AC
  Filled 2014-01-10: qty 2

## 2014-01-10 MED ORDER — TOBRAMYCIN 0.3 % OP OINT
TOPICAL_OINTMENT | OPHTHALMIC | Status: DC | PRN
  Administered 2014-01-10: 1 via OPHTHALMIC

## 2014-01-10 MED ORDER — STERILE WATER FOR INJECTION IJ SOLN
INTRAMUSCULAR | Status: AC
  Filled 2014-01-10: qty 10

## 2014-01-10 MED ORDER — NA CHONDROIT SULF-NA HYALURON 40-30 MG/ML IO SOLN
INTRAOCULAR | Status: AC
  Filled 2014-01-10: qty 0.5

## 2014-01-10 MED ORDER — BSS IO SOLN
INTRAOCULAR | Status: AC
  Filled 2014-01-10: qty 500

## 2014-01-10 SURGICAL SUPPLY — 59 items
AMNIOGRAFT 2.5X2.0 (Orthopedic Implant) ×2 IMPLANT
APPLICATOR COTTON TIP 6IN STRL (MISCELLANEOUS) ×3 IMPLANT
BLADE MINI RND TIP GREEN BEAV (BLADE) ×3 IMPLANT
BLADE SURG 15 STRL LF DISP TIS (BLADE) IMPLANT
BLADE SURG 15 STRL SS (BLADE)
CANISTER SUCTION 2500CC (MISCELLANEOUS) ×3 IMPLANT
CLOSURE WOUND 1/2 X4 (GAUZE/BANDAGES/DRESSINGS) ×1
CORDS BIPOLAR (ELECTRODE) ×3 IMPLANT
COVER MAYO STAND STRL (DRAPES) ×3 IMPLANT
COVER TRANSDUCER ULTRASND (DRAPES) ×6 IMPLANT
DRAPE OPHTHALMIC 40X48 W POUCH (DRAPES) ×3 IMPLANT
DRAPE ORTHO SPLIT 77X108 STRL (DRAPES) ×6
DRAPE RETRACTOR (MISCELLANEOUS) ×3 IMPLANT
DRAPE SURG ORHT 6 SPLT 77X108 (DRAPES) ×1 IMPLANT
DRAPE U-SHAPE 76X120 STRL (DRAPES) ×3 IMPLANT
DRSG TEGADERM 4X4.75 (GAUZE/BANDAGES/DRESSINGS) ×2 IMPLANT
GLOVE BIOGEL PI IND STRL 7.0 (GLOVE) IMPLANT
GLOVE BIOGEL PI IND STRL 7.5 (GLOVE) IMPLANT
GLOVE BIOGEL PI INDICATOR 7.0 (GLOVE) ×2
GLOVE BIOGEL PI INDICATOR 7.5 (GLOVE) ×2
GLOVE SURG SIGNA 7.5 PF LTX (GLOVE) ×3 IMPLANT
GLOVE SURG SS PI 6.0 STRL IVOR (GLOVE) ×4 IMPLANT
GLOVE SURG SS PI 7.0 STRL IVOR (GLOVE) ×4 IMPLANT
GOWN STRL NON-REIN LRG LVL3 (GOWN DISPOSABLE) ×6 IMPLANT
GOWN STRL REIN XL XLG (GOWN DISPOSABLE) ×2 IMPLANT
KIT BASIN OR (CUSTOM PROCEDURE TRAY) ×3 IMPLANT
KNIFE CRESCENT 2.5 55 ANG (BLADE) ×3 IMPLANT
MASK EYE SHIELD (GAUZE/BANDAGES/DRESSINGS) ×2 IMPLANT
NDL 25GX 5/8IN NON SAFETY (NEEDLE) ×1 IMPLANT
NDL HYPO 25GX1X1/2 BEV (NEEDLE) ×1 IMPLANT
NDL HYPO 30X.5 LL (NEEDLE) ×2 IMPLANT
NEEDLE 25GX 5/8IN NON SAFETY (NEEDLE) ×3 IMPLANT
NEEDLE 27GAX1X1/2 (NEEDLE) ×6 IMPLANT
NEEDLE HYPO 25GX1X1/2 BEV (NEEDLE) ×3 IMPLANT
NEEDLE HYPO 30X.5 LL (NEEDLE) ×6 IMPLANT
NS IRRIG 1000ML POUR BTL (IV SOLUTION) ×3 IMPLANT
PACK CATARACT CUSTOM (CUSTOM PROCEDURE TRAY) ×3 IMPLANT
PAD ARMBOARD 7.5X6 YLW CONV (MISCELLANEOUS) ×6 IMPLANT
PAD EYE OVAL STERILE LF (GAUZE/BANDAGES/DRESSINGS) ×2 IMPLANT
SPEAR EYE SURG WECK-CEL (MISCELLANEOUS) ×11 IMPLANT
SPECIMEN JAR SMALL (MISCELLANEOUS) IMPLANT
SPONGE SURGIFOAM ABS GEL 12-7 (HEMOSTASIS) ×2 IMPLANT
STOCKINETTE IMPERVIOUS 9X36 MD (GAUZE/BANDAGES/DRESSINGS) ×6 IMPLANT
STRIP CLOSURE SKIN 1/2X4 (GAUZE/BANDAGES/DRESSINGS) ×1 IMPLANT
SUT ETHILON 10 0 CS140 6 (SUTURE) IMPLANT
SUT ETHILON 10 0 V75 3 (SUTURE) IMPLANT
SUT ETHILON 9 0 BV100 4 (SUTURE) ×3 IMPLANT
SUT SILK 4 0 C 3 735G (SUTURE) IMPLANT
SUT VICRYL 7 0 TG140 8 (SUTURE) ×2 IMPLANT
SUT VICRYL 8 0 TG140 8 (SUTURE) IMPLANT
SYR 20CC LL (SYRINGE) ×4 IMPLANT
SYR 20ML ECCENTRIC (SYRINGE) IMPLANT
SYR BULB 3OZ (MISCELLANEOUS) ×2 IMPLANT
TAPE PAPER MEDFIX 1IN X 10YD (GAUZE/BANDAGES/DRESSINGS) ×2 IMPLANT
TOWEL OR 17X24 6PK STRL BLUE (TOWEL DISPOSABLE) ×3 IMPLANT
TUBE CONNECTING 12'X1/4 (SUCTIONS) ×1
TUBE CONNECTING 12X1/4 (SUCTIONS) ×2 IMPLANT
WATER STERILE IRR 1000ML POUR (IV SOLUTION) ×3 IMPLANT
WIPE INSTRUMENT VISIWIPE 73X73 (MISCELLANEOUS) ×3 IMPLANT

## 2014-01-10 NOTE — H&P (Signed)
Blake LandauJames D Alperin is an 73 y.o. male.   Chief Complaint: Pterygium Right Eye HPI: Pterygium Right Eye with Significant Visual Change  Past Medical History  Diagnosis Date  . Hypothyroidism   . BPH (benign prostatic hypertrophy)   . Fluid in knee   . Anxiety     Past Surgical History  Procedure Laterality Date  . Back surgery      Family History  Problem Relation Age of Onset  . Heart disease Mother 2589    MI  . Cancer Father 1293    lung   Social History:  reports that he has never smoked. He does not have any smokeless tobacco history on file. He reports that he does not drink alcohol or use illicit drugs.  Allergies: No Known Allergies  Medications Prior to Admission  Medication Sig Dispense Refill  . aspirin 81 MG EC tablet Take 81 mg by mouth daily.        . Cholecalciferol (VITAMIN D3) 1000 UNITS tablet Take 1,000 Units by mouth daily.        Marland Kitchen. etodolac (LODINE) 500 MG tablet Take 500 mg by mouth 2 (two) times daily.      Marland Kitchen. levothyroxine (SYNTHROID, LEVOTHROID) 112 MCG tablet Take 112 mcg by mouth daily before breakfast.      . vitamin B-12 (CYANOCOBALAMIN) 1000 MCG tablet Take 1,000 mcg by mouth daily.          No results found for this or any previous visit (from the past 48 hour(s)). No results found.  Review of Systems  Constitutional: Negative.   HENT: Negative.   Eyes: Positive for blurred vision.  Respiratory: Negative.   Cardiovascular: Negative.   Gastrointestinal: Negative.   Genitourinary: Negative.   Musculoskeletal: Negative.   Neurological: Negative.   Endo/Heme/Allergies: Negative.   Psychiatric/Behavioral: Negative.     Blood pressure 149/87, pulse 58, temperature 97.8 F (36.6 C), temperature source Oral, resp. rate 16, SpO2 97.00%. Physical Exam   Assessment/Plan Pterygium right eye Plan: P[facility]ygium [facility], Amniotic Membrane Graft and Tissue Glue  Arlon Bleier V 01/10/2014, 8:14 AM

## 2014-01-10 NOTE — Transfer of Care (Signed)
Immediate Anesthesia Transfer of Care Note  Patient: Blake Zamora  Procedure(s) Performed: Procedure(s): PTERYGIUM EXCISION RIGHT EYE WITH AMNIOTIC GRAFT WITH  TISSUE GLUE MITOMYCIN C (Right) MITOMYCIN C APPLICATION (Right)  Patient Location: PACU  Anesthesia Type:MAC  Level of Consciousness: awake, alert  and oriented  Airway & Oxygen Therapy: Patient Spontanous Breathing  Post-op Assessment: Report given to PACU RN  Post vital signs: Reviewed and stable  Complications: No apparent anesthesia complications

## 2014-01-10 NOTE — Preoperative (Signed)
Beta Blockers   N/A 

## 2014-01-10 NOTE — Anesthesia Procedure Notes (Signed)
Procedure Name: MAC Date/Time: 01/10/2014 8:33 AM Performed by: Tyrone NineSAUVE, Monta Police F Pre-anesthesia Checklist: Patient identified, Timeout performed, Emergency Drugs available, Suction available and Patient being monitored Patient Re-evaluated:Patient Re-evaluated prior to inductionOxygen Delivery Method: Nasal cannula Intubation Type: IV induction Placement Confirmation: positive ETCO2 Dental Injury: Teeth and Oropharynx as per pre-operative assessment

## 2014-01-10 NOTE — Progress Notes (Signed)
MD came to bedside and talked with Patient about needing to sleep in a recliner and follow up tomorrow at the office 830am. No instructions in the AVS.

## 2014-01-10 NOTE — Anesthesia Postprocedure Evaluation (Signed)
  Anesthesia Post-op Note  Patient: Blake LandauJames D Staff  Procedure(s) Performed: Procedure(s): PTERYGIUM EXCISION RIGHT EYE WITH AMNIOTIC GRAFT WITH  TISSUE GLUE MITOMYCIN C (Right) MITOMYCIN C APPLICATION (Right)  Patient Location: PACU  Anesthesia Type:MAC  Level of Consciousness: awake  Airway and Oxygen Therapy: Patient Spontanous Breathing  Post-op Pain: mild  Post-op Assessment: Post-op Vital signs reviewed  Post-op Vital Signs: Reviewed  Complications: No apparent anesthesia complications

## 2014-01-14 ENCOUNTER — Encounter (HOSPITAL_COMMUNITY): Payer: Self-pay | Admitting: Ophthalmology

## 2014-01-29 NOTE — Brief Op Note (Addendum)
01/10/2014  8:54 AM  PATIENT:  Blake Zamora  73 y.o. male  PRE-OPERATIVE DIAGNOSIS:  PTERYGIUM RIGHT EYE  POST-OPERATIVE DIAGNOSIS:  PTERYGIUM RIGHT EYE  PROCEDURE:  Procedure(s): PTERYGIUM EXCISION RIGHT EYE WITH AMNIOTIC GRAFT WITH  TISSUE GLUE MITOMYCIN C (Right) MITOMYCIN C APPLICATION (Right)  SURGEON:  Surgeon(s) and Role:    * Trish FountainPaul V Winter Trefz, MD - Primary  PHYSICIAN ASSISTANT:   ASSISTANTS: none   ANESTHESIA:   local  EBL:     BLOOD ADMINISTERED:none  DRAINS: none   LOCAL MEDICATIONS USED:  MARCAINE    and XYLOCAINE   SPECIMEN:  Source of Specimen:  pterygium right eye  DISPOSITION OF SPECIMEN:  PATHOLOGY  COUNTS:  YES  TOURNIQUET:  * No tourniquets in log *  DICTATION: .Note written in EPIC  PLAN OF CARE: Discharge to home after PACU  PATIENT DISPOSITION:  PACU - hemodynamically stable.   Delay start of Pharmacological VTE agent (>24hrs) due to surgical blood loss or risk of bleeding: not applicable

## 2014-07-29 ENCOUNTER — Other Ambulatory Visit: Payer: Self-pay | Admitting: Internal Medicine

## 2014-08-29 ENCOUNTER — Encounter: Payer: Self-pay | Admitting: Internal Medicine

## 2014-12-01 DIAGNOSIS — H04123 Dry eye syndrome of bilateral lacrimal glands: Secondary | ICD-10-CM | POA: Diagnosis not present

## 2014-12-01 DIAGNOSIS — H40013 Open angle with borderline findings, low risk, bilateral: Secondary | ICD-10-CM | POA: Diagnosis not present

## 2014-12-01 DIAGNOSIS — H11421 Conjunctival edema, right eye: Secondary | ICD-10-CM | POA: Diagnosis not present

## 2014-12-01 DIAGNOSIS — H2513 Age-related nuclear cataract, bilateral: Secondary | ICD-10-CM | POA: Diagnosis not present

## 2014-12-03 ENCOUNTER — Ambulatory Visit (INDEPENDENT_AMBULATORY_CARE_PROVIDER_SITE_OTHER)
Admission: RE | Admit: 2014-12-03 | Discharge: 2014-12-03 | Disposition: A | Discharge status: Home / Self Care | Payer: Medicare Other | Source: Ambulatory Visit | Attending: Internal Medicine | Admitting: Internal Medicine

## 2014-12-03 ENCOUNTER — Encounter: Payer: Self-pay | Admitting: Internal Medicine

## 2014-12-03 ENCOUNTER — Ambulatory Visit (INDEPENDENT_AMBULATORY_CARE_PROVIDER_SITE_OTHER): Payer: Medicare Other | Admitting: Internal Medicine

## 2014-12-03 VITALS — BP 150/90 | HR 51 | Temp 98.1°F | Ht 72.5 in | Wt 228.0 lb

## 2014-12-03 DIAGNOSIS — M5136 Other intervertebral disc degeneration, lumbar region: Secondary | ICD-10-CM | POA: Diagnosis not present

## 2014-12-03 DIAGNOSIS — M545 Low back pain, unspecified: Secondary | ICD-10-CM

## 2014-12-03 DIAGNOSIS — IMO0001 Reserved for inherently not codable concepts without codable children: Secondary | ICD-10-CM

## 2014-12-03 DIAGNOSIS — R030 Elevated blood-pressure reading, without diagnosis of hypertension: Secondary | ICD-10-CM

## 2014-12-03 MED ORDER — TRAMADOL HCL 50 MG PO TABS: 50.0000 mg | ORAL_TABLET | Freq: Two times a day (BID) | ORAL | Status: DC | PRN

## 2014-12-03 NOTE — Patient Instructions (Signed)
Chair yoga or Pilates

## 2014-12-03 NOTE — Assessment & Plan Note (Signed)
Nl BP at home 

## 2014-12-03 NOTE — Progress Notes (Signed)
Pre visit review using our clinic review tool, if applicable. No additional management support is needed unless otherwise documented below in the visit note. 

## 2014-12-03 NOTE — Assessment & Plan Note (Signed)
Chair yoga Tramadol prn X ray LS spine

## 2014-12-03 NOTE — Progress Notes (Signed)
Subjective:    HPI C/o LBP off and on. Last episode x 3-4 months. He had PT at [facility] awhile ago  The patient needs to address chronic BPH that has been well controlled with medicines  Wt Readings from Last 3 Encounters:  12/03/14 228 lb (103.42 kg)  01/06/14 234 lb 14.4 oz (106.55 kg)  11/28/13 231 lb 8 oz (105.008 kg)   BP Readings from Last 3 Encounters:  12/03/14 150/90  01/10/14 130/76  01/06/14 157/81       Review of Systems  Constitutional: Negative for appetite change, fatigue and unexpected weight change.  HENT: Positive for hearing loss (mild). Negative for congestion, nosebleeds, sneezing, sore throat and trouble swallowing.   Eyes: Negative for itching and visual disturbance.  Respiratory: Negative for cough.   Cardiovascular: Negative for chest pain, palpitations and leg swelling.  Gastrointestinal: Negative for nausea, diarrhea, blood in stool and abdominal distention.  Genitourinary: Positive for urgency, frequency and decreased urine volume. Negative for hematuria.  Musculoskeletal: Positive for back pain. Negative for joint swelling, gait problem and neck pain.  Skin: Negative for rash.  Neurological: Negative for dizziness, tremors, speech difficulty and weakness.  Psychiatric/Behavioral: Negative for suicidal ideas, sleep disturbance, dysphoric mood and agitation. The patient is not nervous/anxious.        Objective:   Physical Exam  Constitutional: He is oriented to person, place, and time. He appears well-developed and well-nourished. No distress.  HENT:  Head: Normocephalic and atraumatic.  Right Ear: External ear normal.  Left Ear: External ear normal.  Nose: Nose normal.  Mouth/Throat: Oropharynx is clear and moist. No oropharyngeal exudate.  R eye pterygium  Eyes: Conjunctivae and EOM are normal. Pupils are equal, round, and reactive to light. Right eye exhibits no discharge. Left eye exhibits no discharge. No scleral icterus.  Neck:  Normal range of motion. Neck supple. No JVD present. No tracheal deviation present. No thyromegaly present.  Cardiovascular: Normal rate, regular rhythm, normal heart sounds and intact distal pulses.  Exam reveals no gallop and no friction rub.   No murmur heard. Pulmonary/Chest: Effort normal and breath sounds normal. No stridor. No respiratory distress. He has no wheezes. He has no rales. He exhibits no tenderness.  Abdominal: Soft. Bowel sounds are normal. He exhibits no distension and no mass. There is no tenderness. There is no rebound and no guarding.  Genitourinary: Rectum normal.  Musculoskeletal: Normal range of motion. He exhibits tenderness. He exhibits no edema.  Lymphadenopathy:    He has no cervical adenopathy.  Neurological: He is alert and oriented to person, place, and time. He has normal reflexes. No cranial nerve deficit. He exhibits normal muscle tone. Coordination normal.  Skin: Skin is warm and dry. No rash noted. He is not diaphoretic. No erythema. No pallor.  Psychiatric: He has a normal mood and affect. His behavior is normal. Judgment and thought content normal.  LS is tender  Lab Results  Component Value Date   WBC 4.5 01/06/2014   HGB 12.8* 01/06/2014   HCT 38.2* 01/06/2014   PLT 147* 01/06/2014   GLUCOSE 107* 01/06/2014   CHOL 123 10/16/2012   TRIG 86.0 10/16/2012   HDL 31.10* 10/16/2012   LDLCALC 75 10/16/2012   ALT 22 01/06/2014   AST 21 01/06/2014   NA 141 01/06/2014   K 4.2 01/06/2014   CL 103 01/06/2014   CREATININE 0.97 01/06/2014   BUN 15 01/06/2014   CO2 25 01/06/2014   TSH 0.63 11/28/2013  PSA 0.70 10/16/2012   HGBA1C 6.5 11/28/2013         Assessment & Plan:  Patient ID: Blake Zamora, male   DOB: 1941/09/11, 74 y.o.   MRN: [id number]

## 2014-12-04 ENCOUNTER — Encounter: Payer: Self-pay | Admitting: Internal Medicine

## 2015-01-12 DIAGNOSIS — H40013 Open angle with borderline findings, low risk, bilateral: Secondary | ICD-10-CM | POA: Diagnosis not present

## 2015-01-12 DIAGNOSIS — H11421 Conjunctival edema, right eye: Secondary | ICD-10-CM | POA: Diagnosis not present

## 2015-01-12 DIAGNOSIS — H2513 Age-related nuclear cataract, bilateral: Secondary | ICD-10-CM | POA: Diagnosis not present

## 2015-01-12 DIAGNOSIS — H04123 Dry eye syndrome of bilateral lacrimal glands: Secondary | ICD-10-CM | POA: Diagnosis not present

## 2015-01-30 ENCOUNTER — Telehealth: Payer: Self-pay

## 2015-02-02 NOTE — Telephone Encounter (Signed)
LVM for the patient to call the practice to confirm date of flu vaccination or to call the office for an apt to take a flu shot. 

## 2015-02-07 ENCOUNTER — Other Ambulatory Visit: Payer: Self-pay | Admitting: Internal Medicine

## 2015-03-04 ENCOUNTER — Ambulatory Visit: Payer: Medicare Other | Admitting: Internal Medicine

## 2015-07-11 DIAGNOSIS — L255 Unspecified contact dermatitis due to plants, except food: Secondary | ICD-10-CM | POA: Diagnosis not present

## 2015-07-13 DIAGNOSIS — H04123 Dry eye syndrome of bilateral lacrimal glands: Secondary | ICD-10-CM | POA: Diagnosis not present

## 2015-07-13 DIAGNOSIS — H2513 Age-related nuclear cataract, bilateral: Secondary | ICD-10-CM | POA: Diagnosis not present

## 2015-07-13 DIAGNOSIS — H11421 Conjunctival edema, right eye: Secondary | ICD-10-CM | POA: Diagnosis not present

## 2015-07-13 DIAGNOSIS — H40013 Open angle with borderline findings, low risk, bilateral: Secondary | ICD-10-CM | POA: Diagnosis not present

## 2015-08-07 DIAGNOSIS — J069 Acute upper respiratory infection, unspecified: Secondary | ICD-10-CM | POA: Diagnosis not present

## 2015-12-04 DIAGNOSIS — R21 Rash and other nonspecific skin eruption: Secondary | ICD-10-CM | POA: Diagnosis not present

## 2016-01-13 DIAGNOSIS — H40013 Open angle with borderline findings, low risk, bilateral: Secondary | ICD-10-CM | POA: Diagnosis not present

## 2016-01-13 DIAGNOSIS — H04123 Dry eye syndrome of bilateral lacrimal glands: Secondary | ICD-10-CM | POA: Diagnosis not present

## 2016-01-13 DIAGNOSIS — H11421 Conjunctival edema, right eye: Secondary | ICD-10-CM | POA: Diagnosis not present

## 2016-01-13 DIAGNOSIS — H2513 Age-related nuclear cataract, bilateral: Secondary | ICD-10-CM | POA: Diagnosis not present

## 2016-02-21 ENCOUNTER — Other Ambulatory Visit: Payer: Self-pay | Admitting: Internal Medicine

## 2016-06-30 DIAGNOSIS — H04123 Dry eye syndrome of bilateral lacrimal glands: Secondary | ICD-10-CM | POA: Diagnosis not present

## 2016-06-30 DIAGNOSIS — H40013 Open angle with borderline findings, low risk, bilateral: Secondary | ICD-10-CM | POA: Diagnosis not present

## 2016-06-30 DIAGNOSIS — H11421 Conjunctival edema, right eye: Secondary | ICD-10-CM | POA: Diagnosis not present

## 2016-06-30 DIAGNOSIS — H2513 Age-related nuclear cataract, bilateral: Secondary | ICD-10-CM | POA: Diagnosis not present

## 2016-06-30 DIAGNOSIS — H0014 Chalazion left upper eyelid: Secondary | ICD-10-CM | POA: Diagnosis not present

## 2016-07-12 DIAGNOSIS — H01021 Squamous blepharitis right upper eyelid: Secondary | ICD-10-CM | POA: Diagnosis not present

## 2016-07-12 DIAGNOSIS — H11421 Conjunctival edema, right eye: Secondary | ICD-10-CM | POA: Diagnosis not present

## 2016-07-12 DIAGNOSIS — H04123 Dry eye syndrome of bilateral lacrimal glands: Secondary | ICD-10-CM | POA: Diagnosis not present

## 2016-07-12 DIAGNOSIS — H40013 Open angle with borderline findings, low risk, bilateral: Secondary | ICD-10-CM | POA: Diagnosis not present

## 2016-07-12 DIAGNOSIS — H2513 Age-related nuclear cataract, bilateral: Secondary | ICD-10-CM | POA: Diagnosis not present

## 2016-08-18 ENCOUNTER — Encounter: Payer: Self-pay | Admitting: Internal Medicine

## 2016-11-25 ENCOUNTER — Ambulatory Visit (INDEPENDENT_AMBULATORY_CARE_PROVIDER_SITE_OTHER): Payer: Medicare Other

## 2016-11-25 DIAGNOSIS — Z23 Encounter for immunization: Secondary | ICD-10-CM | POA: Diagnosis not present

## 2017-01-06 DIAGNOSIS — M545 Low back pain: Secondary | ICD-10-CM | POA: Diagnosis not present

## 2017-01-06 DIAGNOSIS — M25552 Pain in left hip: Secondary | ICD-10-CM | POA: Diagnosis not present

## 2017-01-17 DIAGNOSIS — H2513 Age-related nuclear cataract, bilateral: Secondary | ICD-10-CM | POA: Diagnosis not present

## 2017-01-17 DIAGNOSIS — H40013 Open angle with borderline findings, low risk, bilateral: Secondary | ICD-10-CM | POA: Diagnosis not present

## 2017-01-17 DIAGNOSIS — H11421 Conjunctival edema, right eye: Secondary | ICD-10-CM | POA: Diagnosis not present

## 2017-01-17 DIAGNOSIS — H04123 Dry eye syndrome of bilateral lacrimal glands: Secondary | ICD-10-CM | POA: Diagnosis not present

## 2017-01-17 DIAGNOSIS — H01021 Squamous blepharitis right upper eyelid: Secondary | ICD-10-CM | POA: Diagnosis not present

## 2017-03-01 ENCOUNTER — Encounter (HOSPITAL_COMMUNITY): Payer: Self-pay | Admitting: Emergency Medicine

## 2017-03-01 ENCOUNTER — Ambulatory Visit (HOSPITAL_COMMUNITY)
Admission: EM | Admit: 2017-03-01 | Discharge: 2017-03-01 | Disposition: A | Discharge status: Home / Self Care | Payer: Medicare Other | Attending: Family Medicine | Admitting: Family Medicine

## 2017-03-01 DIAGNOSIS — S76012A Strain of muscle, fascia and tendon of left hip, initial encounter: Secondary | ICD-10-CM | POA: Diagnosis not present

## 2017-03-01 DIAGNOSIS — M79605 Pain in left leg: Secondary | ICD-10-CM

## 2017-03-01 MED ORDER — NAPROXEN 500 MG PO TABS: 500.0000 mg | ORAL_TABLET | Freq: Two times a day (BID) | ORAL | 0 refills | Status: DC
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
## 2017-05-02 DIAGNOSIS — H2513 Age-related nuclear cataract, bilateral: Secondary | ICD-10-CM | POA: Diagnosis not present

## 2017-05-02 DIAGNOSIS — H01021 Squamous blepharitis right upper eyelid: Secondary | ICD-10-CM | POA: Diagnosis not present

## 2017-05-02 DIAGNOSIS — H11421 Conjunctival edema, right eye: Secondary | ICD-10-CM | POA: Diagnosis not present

## 2017-05-02 DIAGNOSIS — H40013 Open angle with borderline findings, low risk, bilateral: Secondary | ICD-10-CM | POA: Diagnosis not present

## 2017-05-02 DIAGNOSIS — H04123 Dry eye syndrome of bilateral lacrimal glands: Secondary | ICD-10-CM | POA: Diagnosis not present

## 2017-06-26 NOTE — Progress Notes (Addendum)
Subjective:   Blake Zamora is a 76 y.o. male who presents for Medicare Annual/Subsequent preventive examination.  Review of Systems:  No ROS.  Medicare Wellness Visit. Additional risk factors are reflected in the social history.    Sleep patterns: has frequent nighttime awakenings, gets up 1 times nightly to void and sleeps 4-5 hours nightly. Patient reports insomnia issues, discussed recommended sleep tips and stress reduction tips, education was attached to patient's AVS.  Home Safety/Smoke Alarms: Feels safe in home. Smoke alarms in place.  Living environment; residence and Firearm Safety: 2-story house, no firearms, Lives with wife, no needs for DME, good support system. Seat Belt Safety/Bike Helmet: Wears seat belt.   Counseling:   Eye Exam- appointment yearly [facility]- appointment every 6 months Blake Zamora  Male:   CCS- Last 09/22/09, recall 10 years    PSA-  Lab Results  Component Value Date   PSA 0.70 10/16/2012   PSA 0.77 10/03/2011   PSA 0.53 09/16/2010       Objective:    Vitals: There were no vitals taken for this visit.  There is no height or weight on file to calculate BMI.  Tobacco History  Smoking Status  . Never Smoker  Smokeless Tobacco  . Not on file     Counseling given: Not Answered   Past Medical History:  Diagnosis Date  . Anxiety   . BPH (benign prostatic hypertrophy)   . Fluid in knee   . Hypothyroidism    Past Surgical History:  Procedure Laterality Date  . BACK SURGERY    . MITOMYCIN C APPLICATION Right 01/10/2014   Procedure: MITOMYCIN C APPLICATION;  Surgeon: Blake Fountain, MD;  Location: [facility] OR;  Service: Ophthalmology;  Laterality: Right;  . PTERYGIUM EXCISION Right 01/10/2014   Procedure: PTERYGIUM EXCISION RIGHT EYE WITH AMNIOTIC GRAFT WITH  TISSUE GLUE MITOMYCIN C;  Surgeon: Blake Fountain, MD;  Location: [facility] OR;  Service: Ophthalmology;  Laterality: Right;   Family History  Problem Relation Age of Onset  . Heart  disease Mother 29       MI  . Cancer Father 73       lung   History  Sexual Activity  . Sexual activity: Yes    Outpatient Encounter Prescriptions as of 06/27/2017  Medication Sig  . amLODipine (NORVASC) 2.5 MG tablet Take 2.5 mg by mouth daily.  Marland Kitchen aspirin 81 MG EC tablet Take 81 mg by mouth daily.    . Cholecalciferol (VITAMIN D3) 1000 UNITS tablet Take 1,000 Units by mouth daily.    Marland Kitchen levothyroxine (SYNTHROID, LEVOTHROID) 112 MCG tablet TAKE 1 TABLET BY MOUTH DAILY  . naproxen (NAPROSYN) 500 MG tablet Take 1 tablet (500 mg total) by mouth 2 (two) times daily with a meal.  . vitamin B-12 (CYANOCOBALAMIN) 1000 MCG tablet Take 1,000 mcg by mouth daily.     No facility-administered encounter medications on file as of 06/27/2017.     Activities of Daily Living No flowsheet data found.  Patient Care Team: Zamora, Blake Quint, MD as PCP - General   Assessment:    Physical assessment deferred to PCP.  Exercise Activities and Dietary recommendations   Diet (meal preparation, eat out, water intake, caffeinated beverages, dairy products, fruits and vegetables): in general, a "healthy" diet  , well balanced, eats a variety of fruits and vegetables daily, limits salt, fat/cholesterol, sugar, caffeine, drinks 6-8 glasses of water daily.   Goals    None  Fall Risk No flowsheet data found. Depression Screen No flowsheet data found.  Cognitive Function       Ad8 score reviewed for issues:  Issues making decisions: no  Less interest in hobbies / activities: no  Repeats questions, stories (family complaining): no  Trouble using ordinary gadgets (microwave, computer, phone):no  Forgets the month or year: no  Mismanaging finances: no  Remembering appts: no  Daily problems with thinking and/or memory: no Ad8 score is= 0    Immunization History  Administered Date(s) Administered  . H1N1 10/30/2008  . Influenza Whole 11/30/2009  . Influenza, High Dose Seasonal PF  11/25/2016  . Influenza, Seasonal, Injecte, Preservative Fre 10/16/2012  . Influenza,inj,Quad PF,36+ Mos 11/28/2013  . Pneumococcal Polysaccharide-23 09/23/2010  . Td 11/21/2010  . Zoster 11/21/2013   Screening Tests Health Maintenance  Topic Date Due  . PNA vac Low Risk Adult (2 of 2 - PCV13) 09/24/2011  . INFLUENZA VACCINE  06/21/2017  . COLONOSCOPY  09/23/2019  . TETANUS/TDAP  11/21/2020      Plan:    Continue doing brain stimulating activities (puzzles, reading, adult coloring books, staying active) to keep memory sharp.   Continue to eat heart healthy diet (full of fruits, vegetables, whole grains, lean protein, water--limit salt, fat, and sugar intake) and increase physical activity as tolerated.   I have personally reviewed and noted the following in the patient's chart:   . Medical and social history . Use of alcohol, tobacco or illicit drugs  . Current medications and supplements . Functional ability and status . Nutritional status . Physical activity . Advanced directives . List of other physicians . Vitals . Screenings to include cognitive, depression, and falls . Referrals and appointments  In addition, I have reviewed and discussed with patient certain preventive protocols, quality metrics, and best practice recommendations. A written personalized care plan for preventive services as well as general preventive health recommendations were provided to patient.     Blake PlumpJill A Shaquaya Wuellner, RN  06/26/2017  Medical screening examination/treatment/procedure(s) were performed by non-physician practitioner and as supervising physician I was immediately available for consultation/collaboration. I agree with above. Sonda PrimesAlex Plotnikov, MD

## 2017-06-26 NOTE — Progress Notes (Signed)
Pre visit review using our clinic review tool, if applicable. No additional management support is needed unless otherwise documented below in the visit note. 

## 2017-06-27 ENCOUNTER — Ambulatory Visit (INDEPENDENT_AMBULATORY_CARE_PROVIDER_SITE_OTHER): Payer: Medicare Other | Admitting: *Deleted

## 2017-06-27 VITALS — BP 144/78 | HR 54 | Resp 20 | Ht 73.0 in | Wt 232.0 lb

## 2017-06-27 DIAGNOSIS — Z0000 Encounter for general adult medical examination without abnormal findings: Secondary | ICD-10-CM | POA: Diagnosis not present

## 2017-06-27 NOTE — Patient Instructions (Addendum)
Continue doing brain stimulating activities (puzzles, reading, adult coloring books, staying active) to keep memory sharp.   Continue to eat heart healthy diet (full of fruits, vegetables, whole grains, lean protein, water--limit salt, fat, and sugar intake) and increase physical activity as tolerated.   Blake Zamora , Thank you for taking time to come for your Medicare Wellness Visit. I appreciate your ongoing commitment to your health goals. Please review the following plan we discussed and let me know if I can assist you in the future.   These are the goals we discussed: Goals    . continue to be as healthy as possible so I can continue to play golf and fish       This is a list of the screening recommended for you and due dates:  Health Maintenance  Topic Date Due  . Pneumonia vaccines (2 of 2 - PCV13) 09/24/2011  . Flu Shot  06/21/2017  . Colon Cancer Screening  09/23/2019  . Tetanus Vaccine  11/21/2020    Knee Exercises Ask your health care provider which exercises are safe for you. Do exercises exactly as told by your health care provider and adjust them as directed. It is normal to feel mild stretching, pulling, tightness, or discomfort as you do these exercises, but you should stop right away if you feel sudden pain or your pain gets worse.Do not begin these exercises until told by your health care provider. STRETCHING AND RANGE OF MOTION EXERCISES These exercises warm up your muscles and joints and improve the movement and flexibility of your knee. These exercises also help to relieve pain, numbness, and tingling. Exercise A: Knee Extension, Prone 1. Lie on your abdomen on a bed. 2. Place your left / right knee just beyond the edge of the surface so your knee is not on the bed. You can put a towel under your left / right thigh just above your knee for comfort. 3. Relax your leg muscles and allow gravity to straighten your knee. You should feel a stretch behind your left / right  knee. 4. Hold this position for __________ seconds. 5. Scoot up so your knee is supported between repetitions. Repeat __________ times. Complete this stretch __________ times a day. Exercise B: Knee Flexion, Active  1. Lie on your back with both knees straight. If this causes back discomfort, bend your left / right knee so your foot is flat on the floor. 2. Slowly slide your left / right heel back toward your buttocks until you feel a gentle stretch in the front of your knee or thigh. 3. Hold this position for __________ seconds. 4. Slowly slide your left / right heel back to the starting position. Repeat __________ times. Complete this exercise __________ times a day. Exercise C: Quadriceps, Prone  1. Lie on your abdomen on a firm surface, such as a bed or padded floor. 2. Bend your left / right knee and hold your ankle. If you cannot reach your ankle or pant leg, loop a belt around your foot and grab the belt instead. 3. Gently pull your heel toward your buttocks. Your knee should not slide out to the side. You should feel a stretch in the front of your thigh and knee. 4. Hold this position for __________ seconds. Repeat __________ times. Complete this stretch __________ times a day. Exercise D: Hamstring, Supine 1. Lie on your back. 2. Loop a belt or towel over the ball of your left / right foot. The ball of your  foot is on the walking surface, right under your toes. 3. Straighten your left / right knee and slowly pull on the belt to raise your leg until you feel a gentle stretch behind your knee. ? Do not let your left / right knee bend while you do this. ? Keep your other leg flat on the floor. 4. Hold this position for __________ seconds. Repeat __________ times. Complete this stretch __________ times a day. STRENGTHENING EXERCISES These exercises build strength and endurance in your knee. Endurance is the ability to use your muscles for a long time, even after they get  tired. Exercise E: Quadriceps, Isometric  1. Lie on your back with your left / right leg extended and your other knee bent. Put a rolled towel or small pillow under your knee if told by your health care provider. 2. Slowly tense the muscles in the front of your left / right thigh. You should see your kneecap slide up toward your hip or see increased dimpling just above the knee. This motion will push the back of the knee toward the floor. 3. For __________ seconds, keep the muscle as tight as you can without increasing your pain. 4. Relax the muscles slowly and completely. Repeat __________ times. Complete this exercise __________ times a day. Exercise F: Straight Leg Raises - Quadriceps 1. Lie on your back with your left / right leg extended and your other knee bent. 2. Tense the muscles in the front of your left / right thigh. You should see your kneecap slide up or see increased dimpling just above the knee. Your thigh may even shake a bit. 3. Keep these muscles tight as you raise your leg 4-6 inches (10-15 cm) off the floor. Do not let your knee bend. 4. Hold this position for __________ seconds. 5. Keep these muscles tense as you lower your leg. 6. Relax your muscles slowly and completely after each repetition. Repeat __________ times. Complete this exercise __________ times a day. Exercise G: Hamstring, Isometric 1. Lie on your back on a firm surface. 2. Bend your left / right knee approximately __________ degrees. 3. Dig your left / right heel into the surface as if you are trying to pull it toward your buttocks. Tighten the muscles in the back of your thighs to dig as hard as you can without increasing any pain. 4. Hold this position for __________ seconds. 5. Release the tension gradually and allow your muscles to relax completely for __________ seconds after each repetition. Repeat __________ times. Complete this exercise __________ times a day. Exercise H: Hamstring Curls  If told  by your health care provider, do this exercise while wearing ankle weights. Begin with __________ weights. Then increase the weight by 1 lb (0.5 kg) increments. Do not wear ankle weights that are more than __________. 1. Lie on your abdomen with your legs straight. 2. Bend your left / right knee as far as you can without feeling pain. Keep your hips flat against the floor. 3. Hold this position for __________ seconds. 4. Slowly lower your leg to the starting position.  Repeat __________ times. Complete this exercise __________ times a day. Exercise I: Squats (Quadriceps) 1. Stand in front of a table, with your feet and knees pointing straight ahead. You may rest your hands on the table for balance but not for support. 2. Slowly bend your knees and lower your hips like you are going to sit in a chair. ? Keep your weight over your heels, not  over your toes. ? Keep your lower legs upright so they are parallel with the table legs. ? Do not let your hips go lower than your knees. ? Do not bend lower than told by your health care provider. ? If your knee pain increases, do not bend as low. 3. Hold the squat position for __________ seconds. 4. Slowly push with your legs to return to standing. Do not use your hands to pull yourself to standing. Repeat __________ times. Complete this exercise __________ times a day. Exercise J: Wall Slides (Quadriceps)  1. Lean your back against a smooth wall or door while you walk your feet out 18-24 inches (46-61 cm) from it. 2. Place your feet hip-width apart. 3. Slowly slide down the wall or door until your knees bend __________ degrees. Keep your knees over your heels, not over your toes. Keep your knees in line with your hips. 4. Hold for __________ seconds. Repeat __________ times. Complete this exercise __________ times a day. Exercise K: Straight Leg Raises - Hip Abductors 1. Lie on your side with your left / right leg in the top position. Lie so your head,  shoulder, knee, and hip line up. You may bend your bottom knee to help you keep your balance. 2. Roll your hips slightly forward so your hips are stacked directly over each other and your left / right knee is facing forward. 3. Leading with your heel, lift your top leg 4-6 inches (10-15 cm). You should feel the muscles in your outer hip lifting. ? Do not let your foot drift forward. ? Do not let your knee roll toward the ceiling. 4. Hold this position for __________ seconds. 5. Slowly return your leg to the starting position. 6. Let your muscles relax completely after each repetition. Repeat __________ times. Complete this exercise __________ times a day. Exercise L: Straight Leg Raises - Hip Extensors 1. Lie on your abdomen on a firm surface. You can put a pillow under your hips if that is more comfortable. 2. Tense the muscles in your buttocks and lift your left / right leg about 4-6 inches (10-15 cm). Keep your knee straight as you lift your leg. 3. Hold this position for __________ seconds. 4. Slowly lower your leg to the starting position. 5. Let your leg relax completely after each repetition. Repeat __________ times. Complete this exercise __________ times a day. This information is not intended to replace advice given to you by your health care provider. Make sure you discuss any questions you have with your health care provider. Document Released: 09/21/2005 Document Revised: 08/01/2016 Document Reviewed: 09/13/2015 Elsevier Interactive Patient Education  2018 [employer] Insomnia Insomnia is a sleep disorder that makes it difficult to fall asleep or to stay asleep. Insomnia can cause tiredness (fatigue), low energy, difficulty concentrating, mood swings, and poor performance at work or school. There are three different ways to classify insomnia:  Difficulty falling asleep.  Difficulty staying asleep.  Waking up too early in the morning.  Any type of insomnia can be long-term  (chronic) or short-term (acute). Both are common. Short-term insomnia usually lasts for three months or less. Chronic insomnia occurs at least three times a week for longer than three months. What are the causes? Insomnia may be caused by another condition, situation, or substance, such as:  Anxiety.  Certain medicines.  Gastroesophageal reflux disease (GERD) or other gastrointestinal conditions.  Asthma or other breathing conditions.  Restless legs syndrome, sleep apnea, or other sleep disorders.  Chronic pain.  Menopause. This may include hot flashes.  Stroke.  Abuse of alcohol, tobacco, or illegal drugs.  Depression.  Caffeine.  Neurological disorders, such as Alzheimer disease.  An overactive thyroid (hyperthyroidism).  The cause of insomnia may not be known. What increases the risk? Risk factors for insomnia include:  Gender. Women are more commonly affected than men.  Age. Insomnia is more common as you get older.  Stress. This may involve your professional or personal life.  Income. Insomnia is more common in people with lower income.  Lack of exercise.  Irregular work schedule or night shifts.  Traveling between different time zones.  What are the signs or symptoms? If you have insomnia, trouble falling asleep or trouble staying asleep is the main symptom. This may lead to other symptoms, such as:  Feeling fatigued.  Feeling nervous about going to sleep.  Not feeling rested in the morning.  Having trouble concentrating.  Feeling irritable, anxious, or depressed.  How is this treated? Treatment for insomnia depends on the cause. If your insomnia is caused by an underlying condition, treatment will focus on addressing the condition. Treatment may also include:  Medicines to help you sleep.  Counseling or therapy.  Lifestyle adjustments.  Follow these instructions at home:  Take medicines only as directed by your health care  provider.  Keep regular sleeping and waking hours. Avoid naps.  Keep a sleep diary to help you and your health care provider figure out what could be causing your insomnia. Include: ? When you sleep. ? When you wake up during the night. ? How well you sleep. ? How rested you feel the next day. ? Any side effects of medicines you are taking. ? What you eat and drink.  Make your bedroom a comfortable place where it is easy to fall asleep: ? Put up shades or special blackout curtains to block light from outside. ? Use a white noise machine to block noise. ? Keep the temperature cool.  Exercise regularly as directed by your health care provider. Avoid exercising right before bedtime.  Use relaxation techniques to manage stress. Ask your health care provider to suggest some techniques that may work well for you. These may include: ? Breathing exercises. ? Routines to release muscle tension. ? Visualizing peaceful scenes.  Cut back on alcohol, caffeinated beverages, and cigarettes, especially close to bedtime. These can disrupt your sleep.  Do not overeat or eat spicy foods right before bedtime. This can lead to digestive discomfort that can make it hard for you to sleep.  Limit screen use before bedtime. This includes: ? Watching TV. ? Using your smartphone, tablet, and computer.  Stick to a routine. This can help you fall asleep faster. Try to do a quiet activity, brush your teeth, and go to bed at the same time each night.  Get out of bed if you are still awake after 15 minutes of trying to sleep. Keep the lights down, but try reading or doing a quiet activity. When you feel sleepy, go back to bed.  Make sure that you drive carefully. Avoid driving if you feel very sleepy.  Keep all follow-up appointments as directed by your health care provider. This is important. Contact a health care provider if:  You are tired throughout the day or have trouble in your daily routine due to  sleepiness.  You continue to have sleep problems or your sleep problems get worse. Get help right away if:  You have serious  thoughts about hurting yourself or someone else. This information is not intended to replace advice given to you by your health care provider. Make sure you discuss any questions you have with your health care provider. Document Released: 11/04/2000 Document Revised: 04/08/2016 Document Reviewed: 08/08/2014 Elsevier Interactive Patient Education  2018 [employer].  Carbohydrate Counting for Diabetes Mellitus, Adult Carbohydrate counting is a method for keeping track of how many carbohydrates you eat. Eating carbohydrates naturally increases the amount of sugar (glucose) in the blood. Counting how many carbohydrates you eat helps keep your blood glucose within normal limits, which helps you manage your diabetes (diabetes mellitus). It is important to know how many carbohydrates you can safely have in each meal. This is different for every person. A diet and nutrition specialist (registered dietitian) can help you make a meal plan and calculate how many carbohydrates you should have at each meal and snack. Carbohydrates are found in the following foods:  Grains, such as breads and cereals.  Dried beans and soy products.  Starchy vegetables, such as potatoes, peas, and corn.  Fruit and fruit juices.  Milk and yogurt.  Sweets and snack foods, such as cake, cookies, candy, chips, and soft drinks.  How do I count carbohydrates? There are two ways to count carbohydrates in food. You can use either of the methods or a combination of both. Reading "Nutrition Facts" on packaged food The "Nutrition Facts" list is included on the labels of almost all packaged foods and beverages in the [place] It includes:  The serving size.  Information about nutrients in each serving, including the grams (g) of carbohydrate per serving.  To use the "Nutrition Facts":  Decide how many  servings you will have.  Multiply the number of servings by the number of carbohydrates per serving.  The resulting number is the total amount of carbohydrates that you will be having.  Learning standard serving sizes of other foods When you eat foods containing carbohydrates that are not packaged or do not include "Nutrition Facts" on the label, you need to measure the servings in order to count the amount of carbohydrates:  Measure the foods that you will eat with a food scale or measuring cup, if needed.  Decide how many standard-size servings you will eat.  Multiply the number of servings by 15. Most carbohydrate-rich foods have about 15 g of carbohydrates per serving. ? For example, if you eat 8 oz (170 g) of strawberries, you will have eaten 2 servings and 30 g of carbohydrates (2 servings x 15 g = 30 g).  For foods that have more than one food mixed, such as soups and casseroles, you must count the carbohydrates in each food that is included.  The following list contains standard serving sizes of common carbohydrate-rich foods. Each of these servings has about 15 g of carbohydrates:   hamburger bun or  English muffin.   oz (15 mL) syrup.   oz (14 g) jelly.  1 slice of bread.  1 six-inch tortilla.  3 oz (85 g) cooked rice or pasta.  4 oz (113 g) cooked dried beans.  4 oz (113 g) starchy vegetable, such as peas, corn, or potatoes.  4 oz (113 g) hot cereal.  4 oz (113 g) mashed potatoes or  of a large baked potato.  4 oz (113 g) canned or frozen fruit.  4 oz (120 mL) fruit juice.  4-6 crackers.  6 chicken nuggets.  6 oz (170 g) unsweetened dry cereal.  6 oz (170 g) plain fat-free yogurt or yogurt sweetened with artificial sweeteners.  8 oz (240 mL) milk.  8 oz (170 g) fresh fruit or one small piece of fruit.  24 oz (680 g) popped popcorn.  Example of carbohydrate counting Sample meal  3 oz (85 g) chicken breast.  6 oz (170 g) brown rice.  4 oz  (113 g) corn.  8 oz (240 mL) milk.  8 oz (170 g) strawberries with sugar-free whipped topping. Carbohydrate calculation 1. Identify the foods that contain carbohydrates: ? Rice. ? Corn. ? Milk. ? Strawberries. 2. Calculate how many servings you have of each food: ? 2 servings rice. ? 1 serving corn. ? 1 serving milk. ? 1 serving strawberries. 3. Multiply each number of servings by 15 g: ? 2 servings rice x 15 g = 30 g. ? 1 serving corn x 15 g = 15 g. ? 1 serving milk x 15 g = 15 g. ? 1 serving strawberries x 15 g = 15 g. 4. Add together all of the amounts to find the total grams of carbohydrates eaten: ? 30 g + 15 g + 15 g + 15 g = 75 g of carbohydrates total. This information is not intended to replace advice given to you by your health care provider. Make sure you discuss any questions you have with your health care provider. Document Released: 11/07/2005 Document Revised: 05/27/2016 Document Reviewed: 04/20/2016 Elsevier Interactive Patient Education  2018 [employer]. Insomnia Insomnia is a sleep disorder that makes it difficult to fall asleep or to stay asleep. Insomnia can cause tiredness (fatigue), low energy, difficulty concentrating, mood swings, and poor performance at work or school. There are three different ways to classify insomnia:  Difficulty falling asleep.  Difficulty staying asleep.  Waking up too early in the morning.  Any type of insomnia can be long-term (chronic) or short-term (acute). Both are common. Short-term insomnia usually lasts for three months or less. Chronic insomnia occurs at least three times a week for longer than three months. What are the causes? Insomnia may be caused by another condition, situation, or substance, such as:  Anxiety.  Certain medicines.  Gastroesophageal reflux disease (GERD) or other gastrointestinal conditions.  Asthma or other breathing conditions.  Restless legs syndrome, sleep apnea, or other sleep  disorders.  Chronic pain.  Menopause. This may include hot flashes.  Stroke.  Abuse of alcohol, tobacco, or illegal drugs.  Depression.  Caffeine.  Neurological disorders, such as Alzheimer disease.  An overactive thyroid (hyperthyroidism).  The cause of insomnia may not be known. What increases the risk? Risk factors for insomnia include:  Gender. Women are more commonly affected than men.  Age. Insomnia is more common as you get older.  Stress. This may involve your professional or personal life.  Income. Insomnia is more common in people with lower income.  Lack of exercise.  Irregular work schedule or night shifts.  Traveling between different time zones.  What are the signs or symptoms? If you have insomnia, trouble falling asleep or trouble staying asleep is the main symptom. This may lead to other symptoms, such as:  Feeling fatigued.  Feeling nervous about going to sleep.  Not feeling rested in the morning.  Having trouble concentrating.  Feeling irritable, anxious, or depressed.  How is this treated? Treatment for insomnia depends on the cause. If your insomnia is caused by an underlying condition, treatment will focus on addressing the condition. Treatment may also include:  Medicines  to help you sleep.  Counseling or therapy.  Lifestyle adjustments.  Follow these instructions at home:  Take medicines only as directed by your health care provider.  Keep regular sleeping and waking hours. Avoid naps.  Keep a sleep diary to help you and your health care provider figure out what could be causing your insomnia. Include: ? When you sleep. ? When you wake up during the night. ? How well you sleep. ? How rested you feel the next day. ? Any side effects of medicines you are taking. ? What you eat and drink.  Make your bedroom a comfortable place where it is easy to fall asleep: ? Put up shades or special blackout curtains to block light from  outside. ? Use a white noise machine to block noise. ? Keep the temperature cool.  Exercise regularly as directed by your health care provider. Avoid exercising right before bedtime.  Use relaxation techniques to manage stress. Ask your health care provider to suggest some techniques that may work well for you. These may include: ? Breathing exercises. ? Routines to release muscle tension. ? Visualizing peaceful scenes.  Cut back on alcohol, caffeinated beverages, and cigarettes, especially close to bedtime. These can disrupt your sleep.  Do not overeat or eat spicy foods right before bedtime. This can lead to digestive discomfort that can make it hard for you to sleep.  Limit screen use before bedtime. This includes: ? Watching TV. ? Using your smartphone, tablet, and computer.  Stick to a routine. This can help you fall asleep faster. Try to do a quiet activity, brush your teeth, and go to bed at the same time each night.  Get out of bed if you are still awake after 15 minutes of trying to sleep. Keep the lights down, but try reading or doing a quiet activity. When you feel sleepy, go back to bed.  Make sure that you drive carefully. Avoid driving if you feel very sleepy.  Keep all follow-up appointments as directed by your health care provider. This is important. Contact a health care provider if:  You are tired throughout the day or have trouble in your daily routine due to sleepiness.  You continue to have sleep problems or your sleep problems get worse. Get help right away if:  You have serious thoughts about hurting yourself or someone else. This information is not intended to replace advice given to you by your health care provider. Make sure you discuss any questions you have with your health care provider. Document Released: 11/04/2000 Document Revised: 04/08/2016 Document Reviewed: 08/08/2014 Elsevier Interactive Patient Education  [employer].

## 2017-07-12 ENCOUNTER — Ambulatory Visit (INDEPENDENT_AMBULATORY_CARE_PROVIDER_SITE_OTHER): Payer: Medicare Other | Admitting: Internal Medicine

## 2017-07-12 ENCOUNTER — Other Ambulatory Visit (INDEPENDENT_AMBULATORY_CARE_PROVIDER_SITE_OTHER): Payer: Medicare Other

## 2017-07-12 ENCOUNTER — Encounter: Payer: Self-pay | Admitting: Internal Medicine

## 2017-07-12 VITALS — BP 128/70 | HR 46 | Temp 98.0°F | Wt 229.0 lb

## 2017-07-12 DIAGNOSIS — R7309 Other abnormal glucose: Secondary | ICD-10-CM

## 2017-07-12 DIAGNOSIS — M25561 Pain in right knee: Secondary | ICD-10-CM | POA: Insufficient documentation

## 2017-07-12 DIAGNOSIS — E039 Hypothyroidism, unspecified: Secondary | ICD-10-CM

## 2017-07-12 DIAGNOSIS — R209 Unspecified disturbances of skin sensation: Secondary | ICD-10-CM

## 2017-07-12 DIAGNOSIS — E559 Vitamin D deficiency, unspecified: Secondary | ICD-10-CM

## 2017-07-12 LAB — BASIC METABOLIC PANEL
BUN: 14 mg/dL (ref 6–23)
CHLORIDE: 104 meq/L (ref 96–112)
CO2: 30 mEq/L (ref 19–32)
Calcium: 9.6 mg/dL (ref 8.4–10.5)
Creatinine, Ser: 0.87 mg/dL (ref 0.40–1.50)
GFR: 110.39 mL/min (ref >60.00)
Glucose, Bld: 107 mg/dL — ABNORMAL HIGH (ref 70–99)
Potassium: 4.2 mEq/L (ref 3.5–5.1)
Sodium: 139 mEq/L (ref 135–145)

## 2017-07-12 LAB — HEPATIC FUNCTION PANEL
ALK PHOS: 55 U/L (ref 39–117)
ALT: 18 U/L (ref 0–53)
AST: 21 U/L (ref 0–37)
Albumin: 4.5 g/dL (ref 3.5–5.2)
BILIRUBIN TOTAL: 0.5 mg/dL (ref 0.2–1.2)
Bilirubin, Direct: 0.2 mg/dL (ref 0.0–0.3)
Total Protein: 7.8 g/dL (ref 6.0–8.3)

## 2017-07-12 LAB — VITAMIN D 25 HYDROXY (VIT D DEFICIENCY, FRACTURES): VITD: 41.17 ng/mL (ref 30.00–100.00)

## 2017-07-12 LAB — SEDIMENTATION RATE: SED RATE: 22 mm/h — AB (ref 0–20)

## 2017-07-12 LAB — HEMOGLOBIN A1C: HEMOGLOBIN A1C: 6.4 % (ref 4.6–6.5)

## 2017-07-12 LAB — TSH: TSH: 0.32 u[IU]/mL — ABNORMAL LOW (ref 0.35–4.50)

## 2017-07-12 LAB — VITAMIN B12

## 2017-07-12 NOTE — Progress Notes (Signed)
Subjective:  Patient ID: Blake Zamora, male    DOB: 10-Mar-1941  Age: 76 y.o. MRN: [id number]  CC: No chief complaint on file.   HPI ERASMO DIMARZIO presents for HTN C/o numbness in B feet x 6 months C/o R knee "gives out when trying to stand up"  Outpatient Medications Prior to Visit  Medication Sig Dispense Refill  . amLODipine (NORVASC) 2.5 MG tablet Take 2.5 mg by mouth daily.    Marland Kitchen aspirin 81 MG EC tablet Take 81 mg by mouth daily.      . Cholecalciferol (VITAMIN D3) 1000 UNITS tablet Take 1,000 Units by mouth daily.      Marland Kitchen levothyroxine (SYNTHROID, LEVOTHROID) 112 MCG tablet TAKE 1 TABLET BY MOUTH DAILY 90 tablet 3  . vitamin B-12 (CYANOCOBALAMIN) 1000 MCG tablet Take 1,000 mcg by mouth daily.      . naproxen (NAPROSYN) 500 MG tablet Take 1 tablet (500 mg total) by mouth 2 (two) times daily with a meal. (Patient not taking: Reported on 07/12/2017) 14 tablet 0   No facility-administered medications prior to visit.     ROS Review of Systems  Constitutional: Negative for appetite change, fatigue and unexpected weight change.  HENT: Negative for congestion, nosebleeds, sneezing, sore throat and trouble swallowing.   Eyes: Negative for itching and visual disturbance.  Respiratory: Negative for cough.   Cardiovascular: Negative for chest pain, palpitations and leg swelling.  Gastrointestinal: Negative for abdominal distention, blood in stool, diarrhea and nausea.  Genitourinary: Negative for frequency and hematuria.  Musculoskeletal: Negative for back pain, gait problem, joint swelling and neck pain.  Skin: Negative for rash.  Neurological: Positive for numbness. Negative for dizziness, tremors, speech difficulty and weakness.  Psychiatric/Behavioral: Negative for agitation, dysphoric mood, sleep disturbance and suicidal ideas. The patient is not nervous/anxious.     Objective:  BP 128/70   Pulse (!) 46   Temp 98 F (36.7 C)   Wt 229 lb (103.9 kg)   SpO2 99%   BMI 30.21  kg/m   BP Readings from Last 3 Encounters:  07/12/17 128/70  06/27/17 (!) 144/78  03/01/17 (!) 142/73    Wt Readings from Last 3 Encounters:  07/12/17 229 lb (103.9 kg)  06/27/17 232 lb (105.2 kg)  12/03/14 228 lb (103.4 kg)    Physical Exam  Constitutional: He is oriented to person, place, and time. He appears well-developed. No distress.  NAD  HENT:  Mouth/Throat: Oropharynx is clear and moist.  Eyes: Pupils are equal, round, and reactive to light. Conjunctivae are normal.  Neck: Normal range of motion. No JVD present. No thyromegaly present.  Cardiovascular: Normal rate, regular rhythm, normal heart sounds and intact distal pulses.  Exam reveals no gallop and no friction rub.   No murmur heard. Pulmonary/Chest: Effort normal and breath sounds normal. No respiratory distress. He has no wheezes. He has no rales. He exhibits no tenderness.  Abdominal: Soft. Bowel sounds are normal. He exhibits no distension and no mass. There is no tenderness. There is no rebound and no guarding.  Musculoskeletal: Normal range of motion. He exhibits no edema or tenderness.  Lymphadenopathy:    He has no cervical adenopathy.  Neurological: He is alert and oriented to person, place, and time. He has normal reflexes. No cranial nerve deficit. He exhibits normal muscle tone. He displays a negative Romberg sign. Coordination and gait normal.  Skin: Skin is warm and dry. No rash noted.  Psychiatric: He has a normal mood and  affect. His behavior is normal. Judgment and thought content normal.  B knees MNL B feet - WNL, good pulses  Lab Results  Component Value Date   WBC 4.5 01/06/2014   HGB 12.8 (L) 01/06/2014   HCT 38.2 (L) 01/06/2014   PLT 147 (L) 01/06/2014   GLUCOSE 107 (H) 01/06/2014   CHOL 123 10/16/2012   TRIG 86.0 10/16/2012   HDL 31.10 (L) 10/16/2012   LDLCALC 75 10/16/2012   ALT 22 01/06/2014   AST 21 01/06/2014   NA 141 01/06/2014   K 4.2 01/06/2014   CL 103 01/06/2014    CREATININE 0.97 01/06/2014   BUN 15 01/06/2014   CO2 25 01/06/2014   TSH 0.63 11/28/2013   PSA 0.70 10/16/2012   HGBA1C 6.5 11/28/2013    No results found.  Assessment & Plan:   There are no diagnoses linked to this encounter. I am having Mr. Escudero maintain his aspirin, vitamin B-12, cholecalciferol, levothyroxine, amLODipine, and naproxen.  No orders of the defined types were placed in this encounter.    Follow-up: No Follow-up on file.  Sonda Primes, MD

## 2017-07-12 NOTE — Assessment & Plan Note (Signed)
Labs

## 2017-07-12 NOTE — Assessment & Plan Note (Signed)
labs

## 2017-07-12 NOTE — Assessment & Plan Note (Signed)
Sports Med ref

## 2017-07-12 NOTE — Assessment & Plan Note (Signed)
B LE - chronic neuropathy Labs Treat w/Metformin if needed

## 2017-07-19 ENCOUNTER — Telehealth: Payer: Self-pay | Admitting: Family Medicine

## 2017-07-19 ENCOUNTER — Ambulatory Visit (INDEPENDENT_AMBULATORY_CARE_PROVIDER_SITE_OTHER): Payer: Medicare Other | Admitting: Family Medicine

## 2017-07-19 ENCOUNTER — Encounter: Payer: Self-pay | Admitting: Family Medicine

## 2017-07-19 ENCOUNTER — Ambulatory Visit (INDEPENDENT_AMBULATORY_CARE_PROVIDER_SITE_OTHER)
Admission: RE | Admit: 2017-07-19 | Discharge: 2017-07-19 | Disposition: A | Discharge status: Home / Self Care | Payer: Medicare Other | Source: Ambulatory Visit | Attending: Family Medicine | Admitting: Family Medicine

## 2017-07-19 VITALS — BP 138/70 | HR 66 | Temp 98.5°F | Ht 73.0 in | Wt 229.0 lb

## 2017-07-19 DIAGNOSIS — M179 Osteoarthritis of knee, unspecified: Secondary | ICD-10-CM | POA: Diagnosis not present

## 2017-07-19 DIAGNOSIS — M25561 Pain in right knee: Secondary | ICD-10-CM | POA: Diagnosis not present

## 2017-07-19 NOTE — Telephone Encounter (Signed)
Spoke with patient about his x-ray results.  Myra RudeSchmitz, Jeremy E, MD [facility] 07/19/2017, 1:03 PM

## 2017-07-19 NOTE — Assessment & Plan Note (Signed)
Symptoms seem to be related to arthritis versus degenerative meniscal tear. He has no pain at baseline. - X-rays today - He has a knee brace that he obtained [facility]. He can try using that. - Provided with home exercises - If no improvement can follow-up again consider formal physical therapy

## 2017-07-19 NOTE — Progress Notes (Signed)
Blake Zamora - 76 y.o. male MRN [id number]  Date of birth: 03-09-1941  SUBJECTIVE:  Including CC & ROS.  Chief Complaint  Patient presents with  . Knee Pain    patient states the pain has been going off and on for about 5 months. right knee pain not constant sharp pain and knee gives away and then the pain dissapears     Blake Zamora is a 76 year old male that is presenting with right knee popping and giving way. He reports this started about 5 months ago. He has this occur intermittently. He has some sharp pain associated with it but no pain otherwise at baseline. He has a history of anemia fusion of several years ago but does not occur anymore. He has tried a compression sleeve. He has not tried any medications. He denies any recent injury. The knee giving way occurs when he is getting up from the ground with his knee bent. He denies any prior surgery to his knee.   Saw Dr. Posey Rea on 8/22 for right knee pain. No medicines were given and a referral was placed to sports medicine. He has no imaging on file.  Review of Systems  Cardiovascular: Negative for leg swelling.  Musculoskeletal: Negative for gait problem and joint swelling.  Skin: Negative for color change.  Neurological: Positive for numbness. Negative for weakness.  Hematological: Negative for adenopathy.  otherwise negative   HISTORY: Past Medical, Surgical, Social, and Family History Reviewed & Updated per EMR.   Pertinent Historical Findings include:  Past Medical History:  Diagnosis Date  . Anxiety   . BPH (benign prostatic hypertrophy)   . Fluid in knee   . Hypothyroidism     Past Surgical History:  Procedure Laterality Date  . BACK SURGERY    . MITOMYCIN C APPLICATION Right 01/10/2014   Procedure: MITOMYCIN C APPLICATION;  Surgeon: Trish Fountain, MD;  Location: [facility] OR;  Service: Ophthalmology;  Laterality: Right;  . PTERYGIUM EXCISION Right 01/10/2014   Procedure: PTERYGIUM EXCISION RIGHT EYE WITH AMNIOTIC GRAFT  WITH  TISSUE GLUE MITOMYCIN C;  Surgeon: Trish Fountain, MD;  Location: [facility] OR;  Service: Ophthalmology;  Laterality: Right;    No Known Allergies  Family History  Problem Relation Age of Onset  . Heart disease Mother 51       MI  . Cancer Father 96       lung     Social History   Social History  . Marital status: Married    Spouse name: N/A  . Number of children: N/A  . Years of education: N/A   Occupational History  . Not on file.   Social History Main Topics  . Smoking status: Never Smoker  . Smokeless tobacco: Never Used  . Alcohol use No  . Drug use: No  . Sexual activity: Yes   Other Topics Concern  . Not on file   Social History Narrative  . No narrative on file     PHYSICAL EXAM:  VS: BP 138/70 (BP Location: Left Arm, Patient Position: Sitting, Cuff Size: Normal)   Pulse 66   Temp 98.5 F (36.9 C) (Oral)   Ht 6\' 1"  (1.854 m)   Wt 229 lb (103.9 kg)   SpO2 98%   BMI 30.21 kg/m  Physical Exam Gen: NAD, alert, cooperative with exam, well-appearing ENT: normal lips, normal nasal mucosa,  Eye: normal EOM, normal conjunctiva and lids CV:  no edema, +2 pedal pulses   Resp: no accessory  muscle use, non-labored,  Skin: no rashes, no areas of induration  Neuro: normal tone, normal sensation to touch Psych:  normal insight, alert and oriented MSK:  Right knee: No obvious effusion. No tenderness to palpation of the mediolateral joint line. Normal range of motion. Some instability with valgus stressing. Negative for [facility] test. Neurovascularly intact   Limited ultrasound: Right knee:  No significant effusion in the suprapatellar pouch. Some outpouching of the lateral meniscus Narrowing of the medial joint line but no outpouching of the medial meniscus  Summary: Findings are consistent with a degenerative meniscus normal lateral compartment and medial joint space narrowing  Ultrasound and interpretation by Clare Gandy,  MD          ASSESSMENT & PLAN:   Knee pain, right Symptoms seem to be related to arthritis versus degenerative meniscal tear. He has no pain at baseline. - X-rays today - He has a knee brace that he obtained from the [place]. He can try using that. - Provided with home exercises - If no improvement can follow-up again consider formal physical therapy

## 2017-07-19 NOTE — Patient Instructions (Addendum)
Thank you for coming in,   Please try to do the exercises on a daily basis. I will call you with the results of your xrays    Please feel free to call with any questions or concerns at any time, at [phone]. --Dr. Jordan LikesSchmitz

## 2017-08-22 DIAGNOSIS — H2513 Age-related nuclear cataract, bilateral: Secondary | ICD-10-CM | POA: Diagnosis not present

## 2017-08-22 DIAGNOSIS — H01021 Squamous blepharitis right upper eyelid: Secondary | ICD-10-CM | POA: Diagnosis not present

## 2017-08-22 DIAGNOSIS — H04123 Dry eye syndrome of bilateral lacrimal glands: Secondary | ICD-10-CM | POA: Diagnosis not present

## 2017-08-22 DIAGNOSIS — H11421 Conjunctival edema, right eye: Secondary | ICD-10-CM | POA: Diagnosis not present

## 2017-08-22 DIAGNOSIS — H40013 Open angle with borderline findings, low risk, bilateral: Secondary | ICD-10-CM | POA: Diagnosis not present

## 2017-09-07 ENCOUNTER — Ambulatory Visit (INDEPENDENT_AMBULATORY_CARE_PROVIDER_SITE_OTHER): Payer: Medicare Other | Admitting: General Practice

## 2017-09-07 DIAGNOSIS — Z23 Encounter for immunization: Secondary | ICD-10-CM

## 2017-10-03 ENCOUNTER — Ambulatory Visit (INDEPENDENT_AMBULATORY_CARE_PROVIDER_SITE_OTHER): Payer: Medicare Other | Admitting: Internal Medicine

## 2017-10-03 ENCOUNTER — Other Ambulatory Visit (INDEPENDENT_AMBULATORY_CARE_PROVIDER_SITE_OTHER): Payer: Medicare Other

## 2017-10-03 ENCOUNTER — Encounter: Payer: Self-pay | Admitting: Internal Medicine

## 2017-10-03 DIAGNOSIS — R7309 Other abnormal glucose: Secondary | ICD-10-CM

## 2017-10-03 DIAGNOSIS — E039 Hypothyroidism, unspecified: Secondary | ICD-10-CM

## 2017-10-03 DIAGNOSIS — E038 Other specified hypothyroidism: Secondary | ICD-10-CM | POA: Diagnosis not present

## 2017-10-03 DIAGNOSIS — E063 Autoimmune thyroiditis: Secondary | ICD-10-CM | POA: Diagnosis not present

## 2017-10-03 LAB — TSH: TSH: 0.49 u[IU]/mL (ref 0.35–4.50)

## 2017-10-03 LAB — T4, FREE: FREE T4: 0.93 ng/dL (ref 0.60–1.60)

## 2017-10-03 NOTE — Assessment & Plan Note (Addendum)
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

## 2017-10-03 NOTE — Progress Notes (Signed)
Subjective:  Patient ID: Blake Zamora, male    DOB: 03/24/1941  Age: 76 y.o. MRN: [id number]  CC: No chief complaint on file.   HPI Blake Zamora presents for a c/o Hashimoto thyroiditis because his thyroglobulin ab and thyroid peroxidase ab were elevated (Dr Marlyce HugeMichal Binder arranged for labs after giving a lecture-seminar re: Alzheimer and thyroid connection)  Outpatient Medications Prior to Visit  Medication Sig Dispense Refill  . amLODipine (NORVASC) 2.5 MG tablet Take 2.5 mg by mouth daily.    Marland Kitchen. aspirin 81 MG EC tablet Take 81 mg by mouth daily.      . Cholecalciferol (VITAMIN D3) 1000 UNITS tablet Take 1,000 Units by mouth daily.      Marland Kitchen. levothyroxine (SYNTHROID, LEVOTHROID) 112 MCG tablet TAKE 1 TABLET BY MOUTH DAILY 90 tablet 3  . naproxen (NAPROSYN) 500 MG tablet Take 1 tablet (500 mg total) by mouth 2 (two) times daily with a meal. 14 tablet 0  . vitamin B-12 (CYANOCOBALAMIN) 1000 MCG tablet Take 1,000 mcg by mouth daily.       No facility-administered medications prior to visit.     ROS Review of Systems  Constitutional: Negative for appetite change, fatigue and unexpected weight change.  HENT: Negative for congestion, nosebleeds, sneezing, sore throat and trouble swallowing.   Eyes: Negative for itching and visual disturbance.  Respiratory: Negative for cough.   Cardiovascular: Negative for chest pain, palpitations and leg swelling.  Gastrointestinal: Negative for abdominal distention, blood in stool, diarrhea and nausea.  Genitourinary: Negative for frequency and hematuria.  Musculoskeletal: Negative for back pain, gait problem, joint swelling and neck pain.  Skin: Negative for rash.  Neurological: Negative for dizziness, tremors, speech difficulty and weakness.  Psychiatric/Behavioral: Negative for agitation, dysphoric mood and sleep disturbance. The patient is not nervous/anxious.     Objective:  BP 128/72 (BP Location: Left Arm, Patient Position: Sitting, Cuff  Size: Large)   Pulse (!) 45   Temp 97.8 F (36.6 C) (Oral)   Ht 6\' 1"  (1.854 m)   Wt 230 lb (104.3 kg)   SpO2 99%   BMI 30.34 kg/m   BP Readings from Last 3 Encounters:  10/03/17 128/72  07/19/17 138/70  07/12/17 128/70    Wt Readings from Last 3 Encounters:  10/03/17 230 lb (104.3 kg)  07/19/17 229 lb (103.9 kg)  07/12/17 229 lb (103.9 kg)    Physical Exam  Constitutional: He is oriented to person, place, and time. He appears well-developed. No distress.  NAD  HENT:  Mouth/Throat: Oropharynx is clear and moist.  Eyes: Conjunctivae are normal. Pupils are equal, round, and reactive to light.  Neck: Normal range of motion. No JVD present. No thyromegaly present.  Cardiovascular: Normal rate, regular rhythm, normal heart sounds and intact distal pulses. Exam reveals no gallop and no friction rub.  No murmur heard. Pulmonary/Chest: Effort normal and breath sounds normal. No respiratory distress. He has no wheezes. He has no rales. He exhibits no tenderness.  Abdominal: Soft. Bowel sounds are normal. He exhibits no distension and no mass. There is no tenderness. There is no rebound and no guarding.  Musculoskeletal: Normal range of motion. He exhibits no edema or tenderness.  Lymphadenopathy:    He has no cervical adenopathy.  Neurological: He is alert and oriented to person, place, and time. He has normal reflexes. No cranial nerve deficit. He exhibits normal muscle tone. He displays a negative Romberg sign. Coordination and gait normal.  Skin: Skin is warm  and dry. No rash noted.  Psychiatric: He has a normal mood and affect. His behavior is normal. Judgment and thought content normal.  FTF>20 min  Lab Results  Component Value Date   WBC 4.5 01/06/2014   HGB 12.8 (L) 01/06/2014   HCT 38.2 (L) 01/06/2014   PLT 147 (L) 01/06/2014   GLUCOSE 107 (H) 07/12/2017   CHOL 123 10/16/2012   TRIG 86.0 10/16/2012   HDL 31.10 (L) 10/16/2012   LDLCALC 75 10/16/2012   ALT 18  07/12/2017   AST 21 07/12/2017   NA 139 07/12/2017   K 4.2 07/12/2017   CL 104 07/12/2017   CREATININE 0.87 07/12/2017   BUN 14 07/12/2017   CO2 30 07/12/2017   TSH 0.32 (L) 07/12/2017   PSA 0.70 10/16/2012   HGBA1C 6.4 07/12/2017    Dg Knee Complete 4 Views Right  Result Date: 07/19/2017 CLINICAL DATA:  Five months of intermittent generalize right knee pain with no known injury. History of previous arthrocentesis. EXAM: RIGHT KNEE - COMPLETE 4+ VIEW COMPARISON:  None in PACs FINDINGS: The bones are subjectively adequately mineralized. The joint spaces are reasonably well-maintained. There is beaking of the tibial spines. Tiny spurs arise from the intercondylar notch portion of the lateral femoral condyles. There is faint chondrocalcinosis of the menisci. There is no acute fracture or dislocation. There is no joint effusion. 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 Electronically Signed   By: Blake  Zamora M.D.   On: 07/19/2017 10:36    Assessment & Plan:   There are no diagnoses linked to this encounter. I am having Blake Zamora maintain his aspirin, vitamin B-12, cholecalciferol, levothyroxine, amLODipine, and naproxen.  No orders of the defined types were placed in this encounter.    Follow-up: No Follow-up on file.  Blake PrimesAlex Demmi Sindt, MD

## 2017-10-03 NOTE — Assessment & Plan Note (Signed)
Monitoring

## 2017-11-02 ENCOUNTER — Ambulatory Visit: Payer: Medicare Other | Admitting: Endocrinology

## 2017-11-02 ENCOUNTER — Encounter: Payer: Self-pay | Admitting: Endocrinology

## 2017-11-02 DIAGNOSIS — E041 Nontoxic single thyroid nodule: Secondary | ICD-10-CM | POA: Diagnosis not present

## 2017-11-02 NOTE — Patient Instructions (Signed)
Let's check the ultrasound.  you will receive a phone call, about a day and time for an appointment. Please continue the same medication.

## 2017-11-02 NOTE — Progress Notes (Signed)
Subjective:    Patient ID: Blake Zamora, male    DOB: [birth year], 76 y.o.   MRN: [id number]  HPI Pt is referred by DR Plotnikov, for hypothyroidism.  Pt reports a partial thyroidectomy for hyperthyroidism in 1965.  He did not require any medication until 1995, when he was started on synthroid for hypothyroidism.  He has never taken kelp or any other type of non-prescribed thyroid product.  He has never had thyroid imaging.  He has never had XRT to the neck.  He has never been on amiodarone or lithium.  He has weight gain, and assoc hair loss.  . Past Medical History:  Diagnosis Date  . Anxiety   . BPH (benign prostatic hypertrophy)   . Fluid in knee   . Hypothyroidism     Past Surgical History:  Procedure Laterality Date  . BACK SURGERY    . MITOMYCIN C APPLICATION Right 01/10/2014   Procedure: MITOMYCIN C APPLICATION;  Surgeon: Trish FountainPaul V Kowalski, MD;  [facility];  Service: Ophthalmology;  Laterality: Right;  . PTERYGIUM EXCISION Right 01/10/2014   Procedure: PTERYGIUM EXCISION RIGHT EYE WITH AMNIOTIC GRAFT WITH  TISSUE GLUE MITOMYCIN C;  Surgeon: Trish FountainPaul V Kowalski, MD;  [facility];  Service: Ophthalmology;  Laterality: Right;    Social History   Socioeconomic History  . Marital status: Married    Spouse name: Not on file  . Number of children: Not on file  . Years of education: Not on file  . Highest education level: Not on file  Social Needs  . Financial resource strain: Not on file  . Food insecurity - worry: Not on file  . Food insecurity - inability: Not on file  . Transportation needs - medical: Not on file  . Transportation needs - non-medical: Not on file  Occupational History  . Not on file  Tobacco Use  . Smoking status: Never Smoker  . Smokeless tobacco: Never Used  Substance and Sexual Activity  . Alcohol use: No  . Drug use: No  . Sexual activity: Yes  Other Topics Concern  . Not on file  Social History Narrative  . Not on file    Current  Outpatient Medications on File Prior to Visit  Medication Sig Dispense Refill  . amLODipine (NORVASC) 2.5 MG tablet Take 2.5 mg by mouth daily.    Marland Kitchen. aspirin 81 MG EC tablet Take 81 mg by mouth daily.      . Cholecalciferol (VITAMIN D3) 1000 UNITS tablet Take 1,000 Units by mouth daily.      Marland Kitchen. levothyroxine (SYNTHROID, LEVOTHROID) 112 MCG tablet TAKE 1 TABLET BY MOUTH DAILY 90 tablet 3  . vitamin B-12 (CYANOCOBALAMIN) 1000 MCG tablet Take 1,000 mcg by mouth daily.      . naproxen (NAPROSYN) 500 MG tablet Take 1 tablet (500 mg total) by mouth 2 (two) times daily with a meal. (Patient not taking: Reported on 11/02/2017) 14 tablet 0   No current facility-administered medications on file prior to visit.     No Known Allergies  Family History  Problem Relation Age of Onset  . Heart disease Mother 7089       MI  . Cancer Father 3593       lung  . Thyroid disease Neg Hx     BP (!) 150/78 (BP Location: Left Arm, Patient Position: Sitting, Cuff Size: Normal)   Pulse 72   Wt 235 lb (106.6 kg)   SpO2 95%   BMI 31.00  kg/m     Review of Systems denies depression, muscle cramps, sob, fatigue, memory loss, numbness, blurry vision, cold intolerance, myalgias, rhinorrhea, easy bruising, and syncope.  He has dry skin and constipation.      Objective:   Physical Exam VS: see vs page GEN: no distress HEAD: head: no deformity eyes: no periorbital swelling, no proptosis external nose and ears are normal mouth: no lesion seen Ears; bilat hearing aids NECK: a healed scar is present.  There is a 2 cm right thyroid nodule.   CHEST WALL: no deformity LUNGS: clear to auscultation CV: reg rate and rhythm, no murmur ABD: abdomen is soft, nontender.  no hepatosplenomegaly.  not distended.  no hernia.   MUSCULOSKELETAL: muscle bulk and strength are grossly normal.  no obvious joint swelling.  gait is normal and steady EXTEMITIES: no deformity.  no edema PULSES: no carotid bruit NEURO:  cn 2-12 grossly  intact.   readily moves all 4's.  sensation is intact to touch on all 4's SKIN:  Normal texture and temperature.  No rash or suspicious lesion is visible.   NODES:  None palpable at the neck PSYCH: alert, well-oriented.  Does not appear anxious nor depressed.  outside test results are reviewed: TPO antibodies are pos  Lab Results  Component Value Date   TSH 0.49 10/03/2017   I have reviewed outside records, and summarized: Pt was noted to have thyroiditis, and referred here.  He and wife recently attended a seminar from a chiropractor, who told offered herbal treatment for thyroid problems and Alzheimer's dz     Assessment & Plan:  Nodular thyroid, s/p partial thyroidectomy, but with residual or recurrent nodule. Hypothyroidism, new to me: well-replaced.  Patient Instructions  Let's check the ultrasound.  you will receive a phone call, about a day and time for an appointment. Please continue the same medication.

## 2017-11-28 ENCOUNTER — Ambulatory Visit
Admission: RE | Admit: 2017-11-28 | Discharge: 2017-11-28 | Disposition: A | Discharge status: Home / Self Care | Payer: Medicare Other | Source: Ambulatory Visit | Attending: Endocrinology | Admitting: Endocrinology

## 2017-11-28 DIAGNOSIS — E042 Nontoxic multinodular goiter: Secondary | ICD-10-CM | POA: Diagnosis not present

## 2017-11-28 DIAGNOSIS — E041 Nontoxic single thyroid nodule: Secondary | ICD-10-CM

## 2017-12-05 ENCOUNTER — Telehealth: Payer: Self-pay | Admitting: Endocrinology

## 2017-12-05 NOTE — Telephone Encounter (Signed)
Patient is returning your call.  

## 2017-12-05 NOTE — Telephone Encounter (Signed)
I returned patient's call & left him detailed VM on options for what he wanted to do in regards to his thyroid.

## 2017-12-25 ENCOUNTER — Encounter: Payer: Self-pay | Admitting: Endocrinology

## 2017-12-25 ENCOUNTER — Ambulatory Visit: Payer: Medicare Other | Admitting: Endocrinology

## 2017-12-25 VITALS — BP 140/65 | HR 71 | Wt 233.6 lb

## 2017-12-25 DIAGNOSIS — E063 Autoimmune thyroiditis: Secondary | ICD-10-CM | POA: Diagnosis not present

## 2017-12-25 DIAGNOSIS — E038 Other specified hypothyroidism: Secondary | ICD-10-CM

## 2017-12-25 NOTE — Progress Notes (Signed)
   Subjective:    Patient ID: Blake Zamora, male    DOB: 06/15/1941, 77 y.o.   MRN: [id number]  HPI    Review of Systems     Objective:   Physical Exam        Assessment & Plan:

## 2017-12-25 NOTE — Patient Instructions (Addendum)
Please continue the same medication.   Please come back for a follow-up appointment in 6 months, when you will be due for another ultrasound.

## 2017-12-25 NOTE — Progress Notes (Signed)
Subjective:    Patient ID: Blake Zamora, male    DOB: Sep 14, 1941, 77 y.o.   MRN: [id number]  HPI Pt returns for f/u of hypothyroidism (he had partial thyroidectomy for hyperthyroidism in 1965; he did not require any medication until 1995, when he was started on synthroid for hypothyroidism; US is 2019 showed 2 right-sided nodules). pt states he feels well in general.  He does not notice the goiter.   Past Medical History:  Diagnosis Date  . Anxiety   . BPH (benign prostatic hypertrophy)   . Fluid in knee   . Hypothyroidism     Past Surgical History:  Procedure Laterality Date  . BACK SURGERY    . MITOMYCIN C APPLICATION Right 01/10/2014   Procedure: MITOMYCIN C APPLICATION;  Surgeon: Trish FountainPaul V Kowalski, MD;  [facility];  Service: Ophthalmology;  Laterality: Right;  . PTERYGIUM EXCISION Right 01/10/2014   Procedure: PTERYGIUM EXCISION RIGHT EYE WITH AMNIOTIC GRAFT WITH  TISSUE GLUE MITOMYCIN C;  Surgeon: Trish FountainPaul V Kowalski, MD;  [facility];  Service: Ophthalmology;  Laterality: Right;    Social History   Socioeconomic History  . Marital status: Married    Spouse name: Not on file  . Number of children: Not on file  . Years of education: Not on file  . Highest education level: Not on file  Social Needs  . Financial resource strain: Not on file  . Food insecurity - worry: Not on file  . Food insecurity - inability: Not on file  . Transportation needs - medical: Not on file  . Transportation needs - non-medical: Not on file  Occupational History  . Not on file  Tobacco Use  . Smoking status: Never Smoker  . Smokeless tobacco: Never Used  Substance and Sexual Activity  . Alcohol use: No  . Drug use: No  . Sexual activity: Yes  Other Topics Concern  . Not on file  Social History Narrative  . Not on file    Current Outpatient Medications on File Prior to Visit  Medication Sig Dispense Refill  . amLODipine (NORVASC) 2.5 MG tablet Take 2.5 mg by mouth daily.    Marland Kitchen.  aspirin 81 MG EC tablet Take 81 mg by mouth daily.      . Cholecalciferol (VITAMIN D3) 1000 UNITS tablet Take 1,000 Units by mouth daily.      Marland Kitchen. levothyroxine (SYNTHROID, LEVOTHROID) 112 MCG tablet TAKE 1 TABLET BY MOUTH DAILY 90 tablet 3  . naproxen (NAPROSYN) 500 MG tablet Take 1 tablet (500 mg total) by mouth 2 (two) times daily with a meal. 14 tablet 0  . vitamin B-12 (CYANOCOBALAMIN) 1000 MCG tablet Take 1,000 mcg by mouth daily.       No current facility-administered medications on file prior to visit.     No Known Allergies  Family History  Problem Relation Age of Onset  . Heart disease Mother [phone]       MI  . Cancer Father 193       lung  . Thyroid disease Neg Hx     BP 140/65 (BP Location: Left Arm, Patient Position: Sitting, Cuff Size: Normal)   Pulse 71   Wt 233 lb 9.6 oz (106 kg)   SpO2 96%   BMI 30.82 kg/m    Review of Systems Denies leg swelling, but he reports dry skin.      Objective:   Physical Exam VITAL SIGNS:  See vs page GENERAL: no distress Neck: a healed  scar is present.  The 2 cm right thyroid nodule is again noted.  I may be able to feel the more inferior right nodule, but I am uncertain.   Lab Results  Component Value Date   TSH 0.49 10/03/2017      Assessment & Plan:  Hypothyroidism: well-replaced.  Multinodular goiter: clinically stable.   Patient Instructions  Please continue the same medication.   Please come back for a follow-up appointment in 6 months, when you will be due for another ultrasound.

## 2018-02-20 DIAGNOSIS — H01021 Squamous blepharitis right upper eyelid: Secondary | ICD-10-CM | POA: Diagnosis not present

## 2018-02-20 DIAGNOSIS — H40013 Open angle with borderline findings, low risk, bilateral: Secondary | ICD-10-CM | POA: Diagnosis not present

## 2018-02-20 DIAGNOSIS — H11421 Conjunctival edema, right eye: Secondary | ICD-10-CM | POA: Diagnosis not present

## 2018-02-20 DIAGNOSIS — H2513 Age-related nuclear cataract, bilateral: Secondary | ICD-10-CM | POA: Diagnosis not present

## 2018-02-20 DIAGNOSIS — H04123 Dry eye syndrome of bilateral lacrimal glands: Secondary | ICD-10-CM | POA: Diagnosis not present

## 2018-06-21 IMAGING — DX DG KNEE COMPLETE 4+V*R*
4 series · 4 of 4 positions shown · non-contrast
Comparison: None in PACs

CLINICAL DATA: Five months of intermittent generalize right knee
pain with no known injury. History of previous arthrocentesis.

EXAM:
RIGHT KNEE - COMPLETE 4+ VIEW

[knee ap]
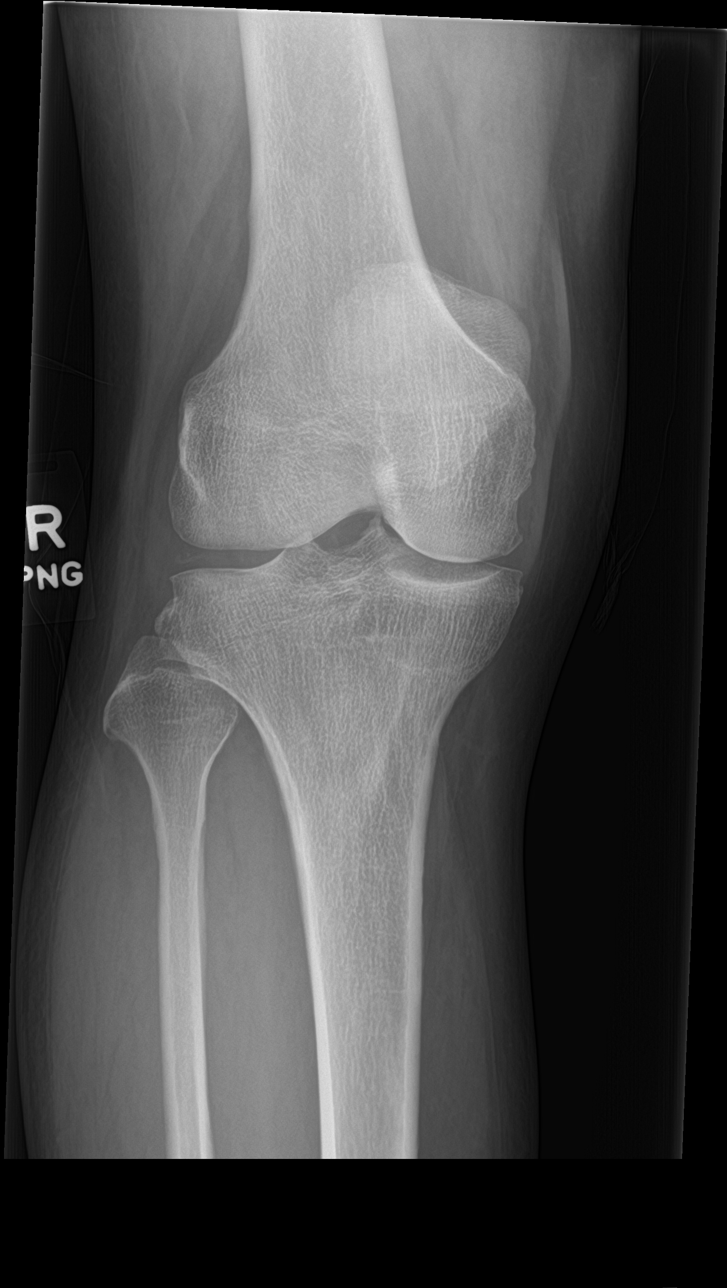

[knee tunnel]
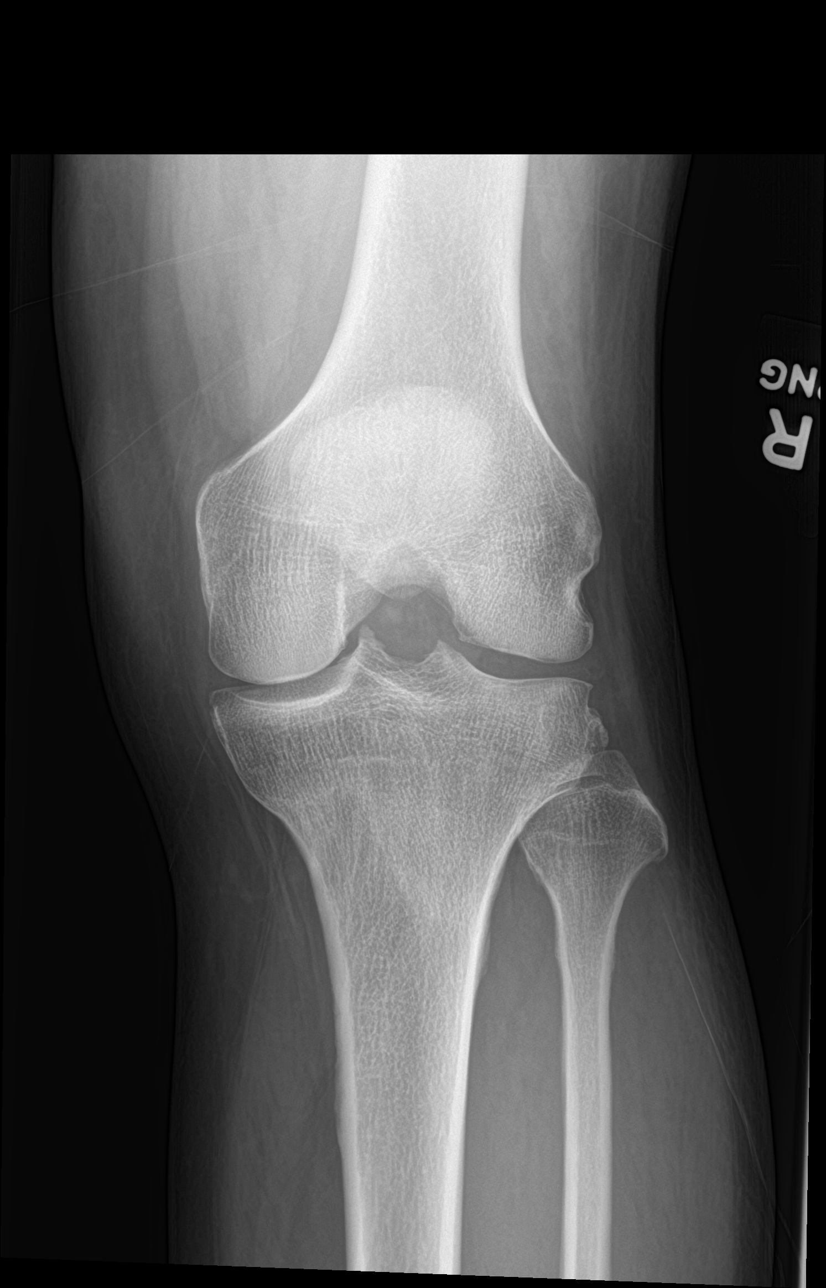

[knee lat]
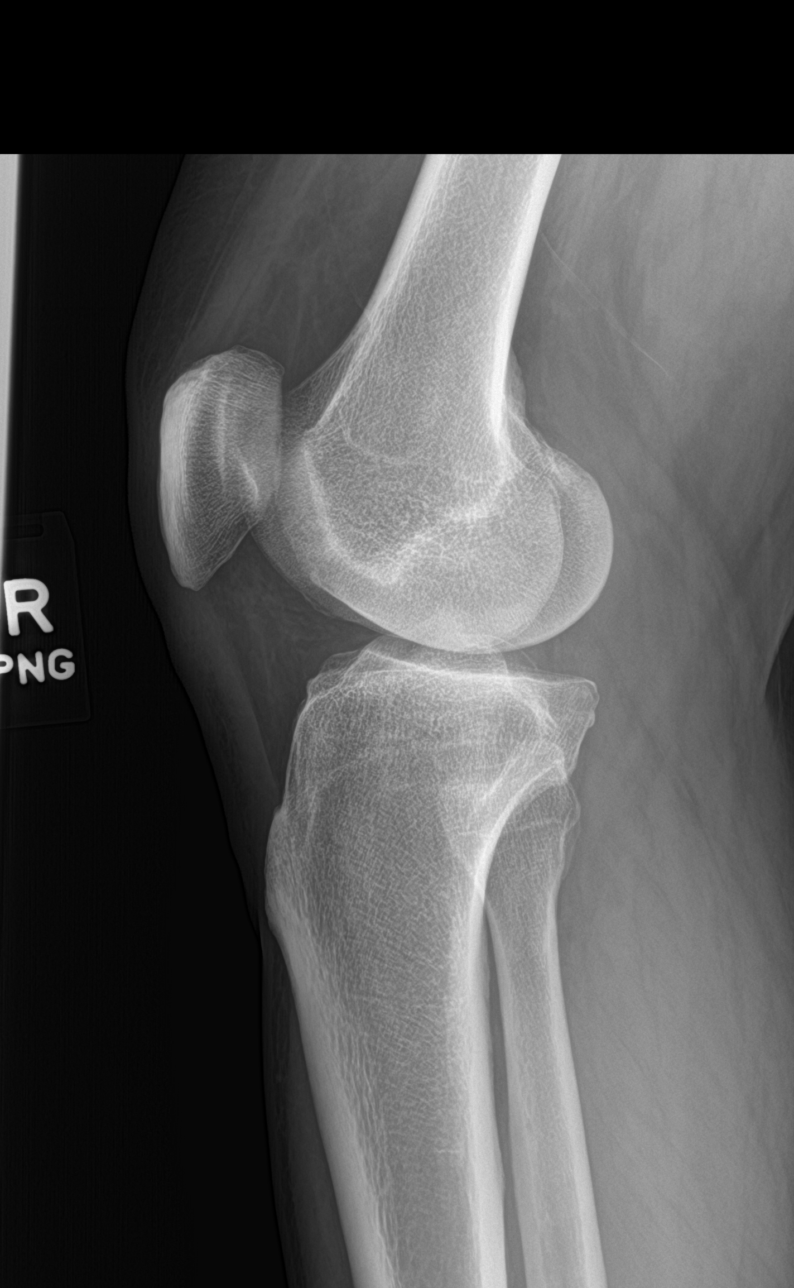

[sunrise]
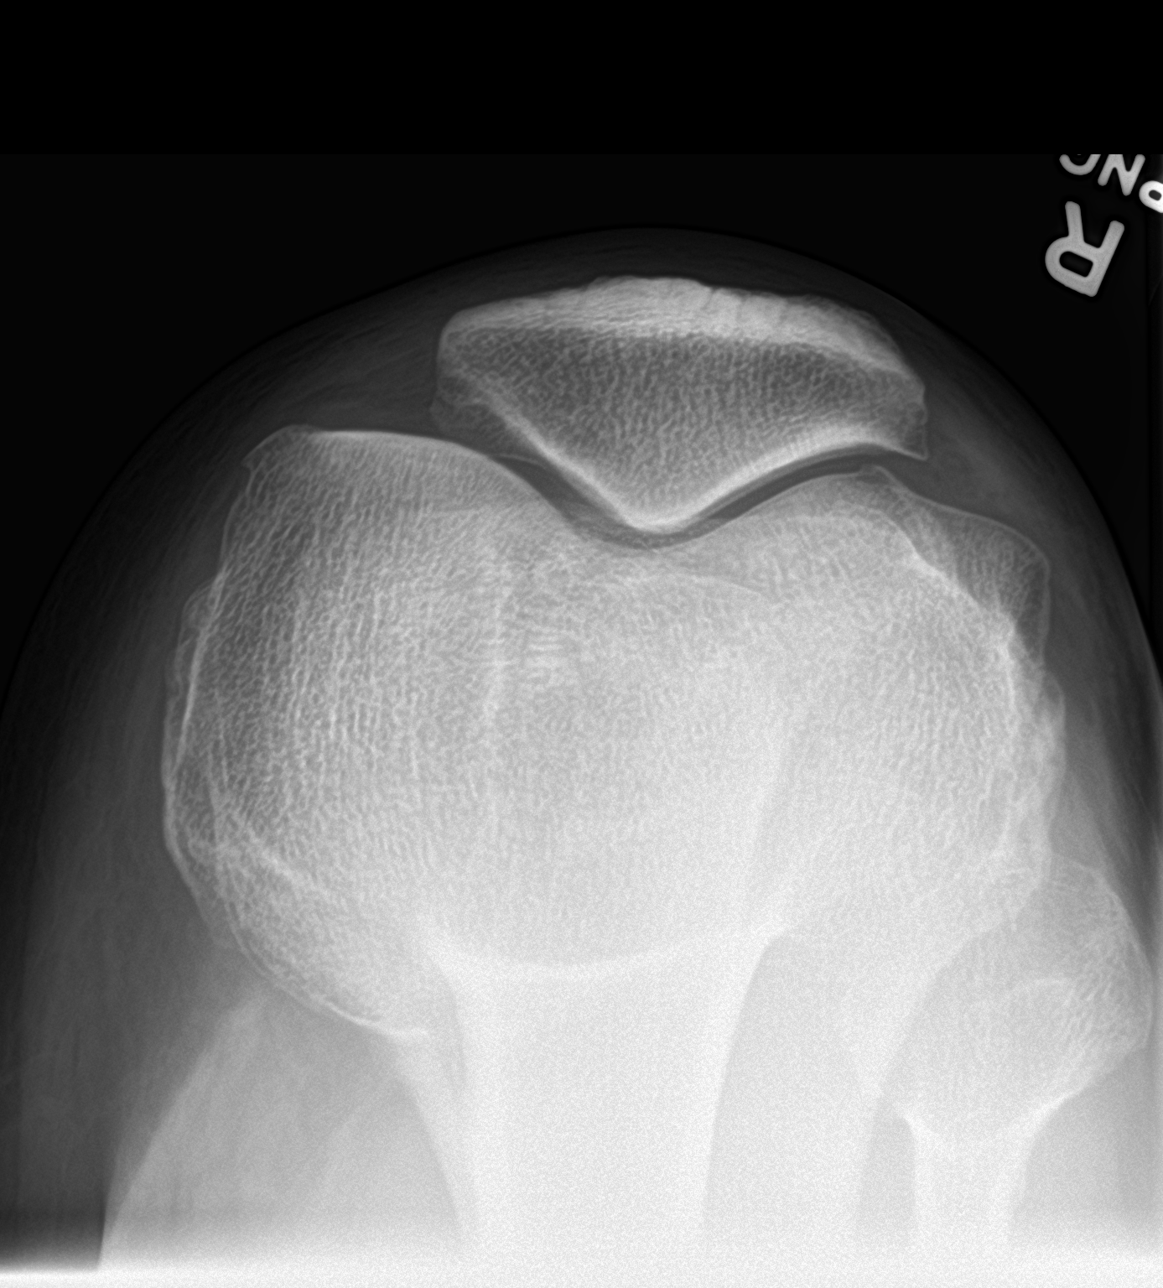

[4 of 4 positions shown; findings below may reference images not displayed]

FINDINGS: The bones are subjectively adequately mineralized. The joint spaces
are reasonably well-maintained. There is beaking of the tibial
spines. Tiny spurs arise from the intercondylar notch portion of the
lateral femoral condyles. There is faint chondrocalcinosis of the
menisci. There is no acute fracture or dislocation. There is no
joint effusion.
IMPRESSION: Mild osteoarthritic change of the right knee without significant
joint space loss. There is chondrocalcinosis of the menisci.

## 2018-06-27 NOTE — Progress Notes (Addendum)
Subjective:   Blake Zamora is a 77 y.o. male who presents for Medicare Annual/Subsequent preventive examination.  Review of Systems:  No ROS.  Medicare Wellness Visit. Additional risk factors are reflected in the social history.  Cardiac Risk Factors include: advanced age (>65men, >33 women);male gender Sleep patterns: has frequent nighttime awakenings, gets up 1-2 times nightly to void and sleeps 5-6 hours nightly.  Patient reports insomnia issues, discussed recommended sleep tips, education was attached to patient's AVS.  Home Safety/Smoke Alarms: Feels safe in home. Smoke alarms in place.  Living environment; residence and Firearm Safety: split level / walkout, no firearms. Lives with wife, no needs for DME, good support system Seat Belt Safety/Bike Helmet: Wears seat belt.   PSA-  Lab Results  Component Value Date   PSA 0.70 10/16/2012   PSA 0.77 10/03/2011   PSA 0.53 09/16/2010       Objective:    Vitals: BP 136/64   Pulse (!) 52   Resp 18   Ht 6\' 1"  (1.854 m)   Wt 228 lb (103.4 kg)   SpO2 98%   BMI 30.08 kg/m   Body mass index is 30.08 kg/m.  Advanced Directives 06/28/2018 06/27/2017 01/06/2014  Does Patient Have a Medical Advance Directive? Yes No Patient has advance directive, copy not in chart  Type of Advance Directive Healthcare Power of [place];Living will - -  Would patient like information on creating a medical advance directive? - Yes (ED - Information included in AVS) -    Tobacco Social History   Tobacco Use  Smoking Status Never Smoker  Smokeless Tobacco Never Used     Counseling given: Not Answered  Past Medical History:  Diagnosis Date  . Anxiety   . BPH (benign prostatic hypertrophy)   . Fluid in knee   . Hypothyroidism    Past Surgical History:  Procedure Laterality Date  . BACK SURGERY    . MITOMYCIN C APPLICATION Right 01/10/2014   Procedure: MITOMYCIN C APPLICATION;  Surgeon: Trish Fountain, MD;  Location: [facility] OR;  Service:  Ophthalmology;  Laterality: Right;  . PTERYGIUM EXCISION Right 01/10/2014   Procedure: PTERYGIUM EXCISION RIGHT EYE WITH AMNIOTIC GRAFT WITH  TISSUE GLUE MITOMYCIN C;  Surgeon: Trish Fountain, MD;  Location: [facility] OR;  Service: Ophthalmology;  Laterality: Right;   Family History  Problem Relation Age of Onset  . Heart disease Mother 33       MI  . Cancer Father 66       lung  . Thyroid disease Neg Hx    Social History   Socioeconomic History  . Marital status: Married    Spouse name: Not on file  . Number of children: Not on file  . Years of education: Not on file  . Highest education level: Not on file  Occupational History  . Not on file  Social Needs  . Financial resource strain: Not hard at all  . Food insecurity:    Worry: Never true    Inability: Never true  . Transportation needs:    Medical: No    Non-medical: No  Tobacco Use  . Smoking status: Never Smoker  . Smokeless tobacco: Never Used  Substance and Sexual Activity  . Alcohol use: No  . Drug use: No  . Sexual activity: Yes  Lifestyle  . Physical activity:    Days per week: 0 days    Minutes per session: 0 min  . Stress: Only a little  Relationships  .  Social connections:    Talks on phone: More than three times a week    Gets together: More than three times a week    Attends religious service: More than 4 times per year    Active member of club or organization: Yes    Attends meetings of clubs or organizations: More than 4 times per year    Relationship status: Married  Other Topics Concern  . Not on file  Social History Narrative  . Not on file    Outpatient Encounter Medications as of 06/28/2018  Medication Sig  . amLODipine (NORVASC) 2.5 MG tablet Take 2.5 mg by mouth daily.  Marland Kitchen aspirin 81 MG EC tablet Take 81 mg by mouth daily.    . Cholecalciferol (VITAMIN D3) 1000 UNITS tablet Take 1,000 Units by mouth daily.    Marland Kitchen levothyroxine (SYNTHROID, LEVOTHROID) 112 MCG tablet TAKE 1 TABLET BY MOUTH  DAILY  . naproxen (NAPROSYN) 500 MG tablet Take 1 tablet (500 mg total) by mouth 2 (two) times daily with a meal.  . vitamin B-12 (CYANOCOBALAMIN) 1000 MCG tablet Take 1,000 mcg by mouth every other day.    No facility-administered encounter medications on file as of 06/28/2018.     Activities of Daily Living In your present state of health, do you have any difficulty performing the following activities: 06/28/2018  Hearing? N  Vision? N  Difficulty concentrating or making decisions? N  Walking or climbing stairs? N  Dressing or bathing? N  Doing errands, shopping? N  Preparing Food and eating ? N  Using the Toilet? N  In the past six months, have you accidently leaked urine? N  Do you have problems with loss of bowel control? N  Managing your Medications? N  Managing your Finances? N  Housekeeping or managing your Housekeeping? N  Some recent data might be hidden    Patient Care Team: Plotnikov, Georgina Quint, MD as PCP - General   Assessment:   This is a routine wellness examination for [place]. Physical assessment deferred to PCP.   Exercise Activities and Dietary recommendations Current Exercise Habits: The patient has a physically strenous job, but has no regular exercise apart from work., Exercise limited by: None identified  Diet (meal preparation, eat out, water intake, caffeinated beverages, dairy products, fruits and vegetables): in general, a "healthy" diet  , well balanced, on average, 2 meals per day   Reviewed heart healthy diet. Encouraged patient to increase daily water and healthy fluid intake. Discussed not skipping meals and to drink nutritional supplements, ensure samples and coupons provided.  Goals    . continue to be as healthy as possible so I can continue to play golf and fish       Fall Risk Fall Risk  06/27/2017  Falls in the past year? Yes  Number falls in past yr: 1  Injury with Fall? No    Depression Screen PHQ 2/9 Scores 06/27/2017  PHQ - 2 Score 0   PHQ- 9 Score 3    Cognitive Function MMSE - Mini Mental State Exam 06/28/2018  Orientation to time 5  Orientation to Place 5  Registration 3  Attention/ Calculation 5  Recall 3  Language- name 2 objects 2  Language- repeat 1  Language- follow 3 step command 3  Language- read & follow direction 1  Write a sentence 1  Copy design 1  Total score 30        Immunization History  Administered Date(s) Administered  . H1N1  10/30/2008  . Influenza Whole 11/30/2009  . Influenza, High Dose Seasonal PF 11/25/2016, 09/07/2017  . Influenza, Seasonal, Injecte, Preservative Fre 10/16/2012  . Influenza,inj,Quad PF,6+ Mos 11/28/2013  . Pneumococcal Polysaccharide-23 09/23/2010  . Td 11/21/2010  . Zoster 11/21/2013   Screening Tests Health Maintenance  Topic Date Due  . PNA vac Low Risk Adult (2 of 2 - PCV13) 09/24/2011  . INFLUENZA VACCINE  06/21/2018  . COLONOSCOPY  09/23/2019  . TETANUS/TDAP  11/21/2020       Plan:     Continue doing brain stimulating activities (puzzles, reading, adult coloring books, staying active) to keep memory sharp.   Continue to eat heart healthy diet (full of fruits, vegetables, whole grains, lean protein, water--limit salt, fat, and sugar intake) and increase physical activity as tolerated.  I have personally reviewed and noted the following in the patient's chart:   . Medical and social history . Use of alcohol, tobacco or illicit drugs  . Current medications and supplements . Functional ability and status . Nutritional status . Physical activity . Advanced directives . List of other physicians . Vitals . Screenings to include cognitive, depression, and falls . Referrals and appointments  In addition, I have reviewed and discussed with patient certain preventive protocols, quality metrics, and best practice recommendations. A written personalized care plan for preventive services as well as general preventive health recommendations were  provided to patient.     Wanda PlumpJill A Vannie Hilgert, RN  06/28/2018  Medical screening examination/treatment/procedure(s) were performed by non-physician practitioner and as supervising physician I was immediately available for consultation/collaboration. I agree with above. Jacinta ShoeAleksei Plotnikov, MD

## 2018-06-28 ENCOUNTER — Ambulatory Visit (INDEPENDENT_AMBULATORY_CARE_PROVIDER_SITE_OTHER): Payer: Medicare Other | Admitting: *Deleted

## 2018-06-28 VITALS — BP 136/64 | HR 52 | Resp 18 | Ht 73.0 in | Wt 228.0 lb

## 2018-06-28 DIAGNOSIS — Z0000 Encounter for general adult medical examination without abnormal findings: Secondary | ICD-10-CM

## 2018-06-28 NOTE — Patient Instructions (Addendum)
Continue doing brain stimulating activities (puzzles, reading, adult coloring books, staying active) to keep memory sharp.   Continue to eat heart healthy diet (full of fruits, vegetables, whole grains, lean protein, water--limit salt, fat, and sugar intake) and increase physical activity as tolerated.  Knee Exercises Ask your health care provider which exercises are safe for you. Do exercises exactly as told by your health care provider and adjust them as directed. It is normal to feel mild stretching, pulling, tightness, or discomfort as you do these exercises, but you should stop right away if you feel sudden pain or your pain gets worse.Do not begin these exercises until told by your health care provider. STRETCHING AND RANGE OF MOTION EXERCISES These exercises warm up your muscles and joints and improve the movement and flexibility of your knee. These exercises also help to relieve pain, numbness, and tingling. Exercise A: Knee Extension, Prone 1. Lie on your abdomen on a bed. 2. Place your left / right knee just beyond the edge of the surface so your knee is not on the bed. You can put a towel under your left / right thigh just above your knee for comfort. 3. Relax your leg muscles and allow gravity to straighten your knee. You should feel a stretch behind your left / right knee. 4. Hold this position for __________ seconds. 5. Scoot up so your knee is supported between repetitions. Repeat __________ times. Complete this stretch __________ times a day. Exercise B: Knee Flexion, Active  1. Lie on your back with both knees straight. If this causes back discomfort, bend your left / right knee so your foot is flat on the floor. 2. Slowly slide your left / right heel back toward your buttocks until you feel a gentle stretch in the front of your knee or thigh. 3. Hold this position for __________ seconds. 4. Slowly slide your left / right heel back to the starting position. Repeat __________  times. Complete this exercise __________ times a day. Exercise C: Quadriceps, Prone  1. Lie on your abdomen on a firm surface, such as a bed or padded floor. 2. Bend your left / right knee and hold your ankle. If you cannot reach your ankle or pant leg, loop a belt around your foot and grab the belt instead. 3. Gently pull your heel toward your buttocks. Your knee should not slide out to the side. You should feel a stretch in the front of your thigh and knee. 4. Hold this position for __________ seconds. Repeat __________ times. Complete this stretch __________ times a day. Exercise D: Hamstring, Supine 1. Lie on your back. 2. Loop a belt or towel over the ball of your left / right foot. The ball of your foot is on the walking surface, right under your toes. 3. Straighten your left / right knee and slowly pull on the belt to raise your leg until you feel a gentle stretch behind your knee. ? Do not let your left / right knee bend while you do this. ? Keep your other leg flat on the floor. 4. Hold this position for __________ seconds. Repeat __________ times. Complete this stretch __________ times a day. STRENGTHENING EXERCISES These exercises build strength and endurance in your knee. Endurance is the ability to use your muscles for a long time, even after they get tired. Exercise E: Quadriceps, Isometric  1. Lie on your back with your left / right leg extended and your other knee bent. Put a rolled towel or  small pillow under your knee if told by your health care provider. 2. Slowly tense the muscles in the front of your left / right thigh. You should see your kneecap slide up toward your hip or see increased dimpling just above the knee. This motion will push the back of the knee toward the floor. 3. For __________ seconds, keep the muscle as tight as you can without increasing your pain. 4. Relax the muscles slowly and completely. Repeat __________ times. Complete this exercise __________  times a day. Exercise F: Straight Leg Raises - Quadriceps 1. Lie on your back with your left / right leg extended and your other knee bent. 2. Tense the muscles in the front of your left / right thigh. You should see your kneecap slide up or see increased dimpling just above the knee. Your thigh may even shake a bit. 3. Keep these muscles tight as you raise your leg 4-6 inches (10-15 cm) off the floor. Do not let your knee bend. 4. Hold this position for __________ seconds. 5. Keep these muscles tense as you lower your leg. 6. Relax your muscles slowly and completely after each repetition. Repeat __________ times. Complete this exercise __________ times a day. Exercise G: Hamstring, Isometric 1. Lie on your back on a firm surface. 2. Bend your left / right knee approximately __________ degrees. 3. Dig your left / right heel into the surface as if you are trying to pull it toward your buttocks. Tighten the muscles in the back of your thighs to dig as hard as you can without increasing any pain. 4. Hold this position for __________ seconds. 5. Release the tension gradually and allow your muscles to relax completely for __________ seconds after each repetition. Repeat __________ times. Complete this exercise __________ times a day. Exercise H: Hamstring Curls  If told by your health care provider, do this exercise while wearing ankle weights. Begin with __________ weights. Then increase the weight by 1 lb (0.5 kg) increments. Do not wear ankle weights that are more than __________. 1. Lie on your abdomen with your legs straight. 2. Bend your left / right knee as far as you can without feeling pain. Keep your hips flat against the floor. 3. Hold this position for __________ seconds. 4. Slowly lower your leg to the starting position.  Repeat __________ times. Complete this exercise __________ times a day. Exercise I: Squats (Quadriceps) 1. Stand in front of a table, with your feet and knees  pointing straight ahead. You may rest your hands on the table for balance but not for support. 2. Slowly bend your knees and lower your hips like you are going to sit in a chair. ? Keep your weight over your heels, not over your toes. ? Keep your lower legs upright so they are parallel with the table legs. ? Do not let your hips go lower than your knees. ? Do not bend lower than told by your health care provider. ? If your knee pain increases, do not bend as low. 3. Hold the squat position for __________ seconds. 4. Slowly push with your legs to return to standing. Do not use your hands to pull yourself to standing. Repeat __________ times. Complete this exercise __________ times a day. Exercise J: Wall Slides (Quadriceps)  1. Lean your back against a smooth wall or door while you walk your feet out 18-24 inches (46-61 cm) from it. 2. Place your feet hip-width apart. 3. Slowly slide down the wall or door  until your knees bend __________ degrees. Keep your knees over your heels, not over your toes. Keep your knees in line with your hips. 4. Hold for __________ seconds. Repeat __________ times. Complete this exercise __________ times a day. Exercise K: Straight Leg Raises - Hip Abductors 1. Lie on your side with your left / right leg in the top position. Lie so your head, shoulder, knee, and hip line up. You may bend your bottom knee to help you keep your balance. 2. Roll your hips slightly forward so your hips are stacked directly over each other and your left / right knee is facing forward. 3. Leading with your heel, lift your top leg 4-6 inches (10-15 cm). You should feel the muscles in your outer hip lifting. ? Do not let your foot drift forward. ? Do not let your knee roll toward the ceiling. 4. Hold this position for __________ seconds. 5. Slowly return your leg to the starting position. 6. Let your muscles relax completely after each repetition. Repeat __________ times. Complete this  exercise __________ times a day. Exercise L: Straight Leg Raises - Hip Extensors 1. Lie on your abdomen on a firm surface. You can put a pillow under your hips if that is more comfortable. 2. Tense the muscles in your buttocks and lift your left / right leg about 4-6 inches (10-15 cm). Keep your knee straight as you lift your leg. 3. Hold this position for __________ seconds. 4. Slowly lower your leg to the starting position. 5. Let your leg relax completely after each repetition. Repeat __________ times. Complete this exercise __________ times a day. This information is not intended to replace advice given to you by your health care provider. Make sure you discuss any questions you have with your health care provider. Document Released: 09/21/2005 Document Revised: 08/01/2016 Document Reviewed: 09/13/2015 Elsevier Interactive Patient Education  2018 [employer]   Insomnia Insomnia is a sleep disorder that makes it difficult to fall asleep or to stay asleep. Insomnia can cause tiredness (fatigue), low energy, difficulty concentrating, mood swings, and poor performance at work or school. There are three different ways to classify insomnia:  Difficulty falling asleep.  Difficulty staying asleep.  Waking up too early in the morning.  Any type of insomnia can be long-term (chronic) or short-term (acute). Both are common. Short-term insomnia usually lasts for three months or less. Chronic insomnia occurs at least three times a week for longer than three months. What are the causes? Insomnia may be caused by another condition, situation, or substance, such as:  Anxiety.  Certain medicines.  Gastroesophageal reflux disease (GERD) or other gastrointestinal conditions.  Asthma or other breathing conditions.  Restless legs syndrome, sleep apnea, or other sleep disorders.  Chronic pain.  Menopause. This may include hot flashes.  Stroke.  Abuse of alcohol, tobacco, or illegal  drugs.  Depression.  Caffeine.  Neurological disorders, such as Alzheimer disease.  An overactive thyroid (hyperthyroidism).  The cause of insomnia may not be known. What increases the risk? Risk factors for insomnia include:  Gender. Women are more commonly affected than men.  Age. Insomnia is more common as you get older.  Stress. This may involve your professional or personal life.  Income. Insomnia is more common in people with lower income.  Lack of exercise.  Irregular work schedule or night shifts.  Traveling between different time zones.  What are the signs or symptoms? If you have insomnia, trouble falling asleep or trouble staying asleep is  the main symptom. This may lead to other symptoms, such as:  Feeling fatigued.  Feeling nervous about going to sleep.  Not feeling rested in the morning.  Having trouble concentrating.  Feeling irritable, anxious, or depressed.  How is this treated? Treatment for insomnia depends on the cause. If your insomnia is caused by an underlying condition, treatment will focus on addressing the condition. Treatment may also include:  Medicines to help you sleep.  Counseling or therapy.  Lifestyle adjustments.  Follow these instructions at home:  Take medicines only as directed by your health care provider.  Keep regular sleeping and waking hours. Avoid naps.  Keep a sleep diary to help you and your health care provider figure out what could be causing your insomnia. Include: ? When you sleep. ? When you wake up during the night. ? How well you sleep. ? How rested you feel the next day. ? Any side effects of medicines you are taking. ? What you eat and drink.  Make your bedroom a comfortable place where it is easy to fall asleep: ? Put up shades or special blackout curtains to block light from outside. ? Use a white noise machine to block noise. ? Keep the temperature cool.  Exercise regularly as directed by  your health care provider. Avoid exercising right before bedtime.  Use relaxation techniques to manage stress. Ask your health care provider to suggest some techniques that may work well for you. These may include: ? Breathing exercises. ? Routines to release muscle tension. ? Visualizing peaceful scenes.  Cut back on alcohol, caffeinated beverages, and cigarettes, especially close to bedtime. These can disrupt your sleep.  Do not overeat or eat spicy foods right before bedtime. This can lead to digestive discomfort that can make it hard for you to sleep.  Limit screen use before bedtime. This includes: ? Watching TV. ? Using your smartphone, tablet, and computer.  Stick to a routine. This can help you fall asleep faster. Try to do a quiet activity, brush your teeth, and go to bed at the same time each night.  Get out of bed if you are still awake after 15 minutes of trying to sleep. Keep the lights down, but try reading or doing a quiet activity. When you feel sleepy, go back to bed.  Make sure that you drive carefully. Avoid driving if you feel very sleepy.  Keep all follow-up appointments as directed by your health care provider. This is important. Contact a health care provider if:  You are tired throughout the day or have trouble in your daily routine due to sleepiness.  You continue to have sleep problems or your sleep problems get worse. Get help right away if:  You have serious thoughts about hurting yourself or someone else. This information is not intended to replace advice given to you by your health care provider. Make sure you discuss any questions you have with your health care provider. Document Released: 11/04/2000 Document Revised: 04/08/2016 Document Reviewed: 08/08/2014 Elsevier Interactive Patient Education  2018 [employer].   Health Maintenance, Male A healthy lifestyle and preventive care is important for your health and wellness. Ask your health care  provider about what schedule of regular examinations is right for you. What should I know about weight and diet? Eat a Healthy Diet  Eat plenty of vegetables, fruits, whole grains, low-fat dairy products, and lean protein.  Do not eat a lot of foods high in solid fats, added sugars, or salt.  Maintain a Healthy Weight Regular exercise can help you achieve or maintain a healthy weight. You should:  Do at least 150 minutes of exercise each week. The exercise should increase your heart rate and make you sweat (moderate-intensity exercise).  Do strength-training exercises at least twice a week.  Watch Your Levels of Cholesterol and Blood Lipids  Have your blood tested for lipids and cholesterol every 5 years starting at 75 years of age. If you are at high risk for heart disease, you should start having your blood tested when you are 75 years old. You may need to have your cholesterol levels checked more often if: ? Your lipid or cholesterol levels are high. ? You are older than 75 years of age. ? You are at high risk for heart disease.  What should I know about cancer screening? Many types of cancers can be detected early and may often be prevented. Lung Cancer  You should be screened every year for lung cancer if: ? You are a current smoker who has smoked for at least 30 years. ? You are a former smoker who has quit within the past 15 years.  Talk to your health care provider about your screening options, when you should start screening, and how often you should be screened.  Colorectal Cancer  Routine colorectal cancer screening usually begins at 75 years of age and should be repeated every 5-10 years until you are 75 years old. You may need to be screened more often if early forms of precancerous polyps or small growths are found. Your health care provider may recommend screening at an earlier age if you have risk factors for colon cancer.  Your health care provider may recommend  using home test kits to check for hidden blood in the stool.  A small camera at the end of a tube can be used to examine your colon (sigmoidoscopy or colonoscopy). This checks for the earliest forms of colorectal cancer.  Prostate and Testicular Cancer  Depending on your age and overall health, your health care provider may do certain tests to screen for prostate and testicular cancer.  Talk to your health care provider about any symptoms or concerns you have about testicular or prostate cancer.  Skin Cancer  Check your skin from head to toe regularly.  Tell your health care provider about any new moles or changes in moles, especially if: ? There is a change in a mole's size, shape, or color. ? You have a mole that is larger than a pencil eraser.  Always use sunscreen. Apply sunscreen liberally and repeat throughout the day.  Protect yourself by wearing long sleeves, pants, a wide-brimmed hat, and sunglasses when outside.  What should I know about heart disease, diabetes, and high blood pressure?  If you are 64-48 years of age, have your blood pressure checked every 3-5 years. If you are 26 years of age or older, have your blood pressure checked every year. You should have your blood pressure measured twice-once when you are at a hospital or clinic, and once when you are not at a hospital or clinic. Record the average of the two measurements. To check your blood pressure when you are not at a hospital or clinic, you can use: ? An automated blood pressure machine at a pharmacy. ? A home blood pressure monitor.  Talk to your health care provider about your target blood pressure.  If you are between 17-32 years old, ask your health care provider if you  should take aspirin to prevent heart disease.  Have regular diabetes screenings by checking your fasting blood sugar level. ? If you are at a normal weight and have a low risk for diabetes, have this test once every three years after the  age of 16. ? If you are overweight and have a high risk for diabetes, consider being tested at a younger age or more often.  A one-time screening for abdominal aortic aneurysm (AAA) by ultrasound is recommended for men aged 27-75 years who are current or former smokers. What should I know about preventing infection? Hepatitis B If you have a higher risk for hepatitis B, you should be screened for this virus. Talk with your health care provider to find out if you are at risk for hepatitis B infection. Hepatitis C Blood testing is recommended for:  Everyone born from [birth year] through 1963.  Anyone with known risk factors for hepatitis C.  Sexually Transmitted Diseases (STDs)  You should be screened each year for STDs including gonorrhea and chlamydia if: ? You are sexually active and are younger than 75 years of age. ? You are older than 75 years of age and your health care provider tells you that you are at risk for this type of infection. ? Your sexual activity has changed since you were last screened and you are at an increased risk for chlamydia or gonorrhea. Ask your health care provider if you are at risk.  Talk with your health care provider about whether you are at high risk of being infected with HIV. Your health care provider may recommend a prescription medicine to help prevent HIV infection.  What else can I do?  Schedule regular health, dental, and eye exams.  Stay current with your vaccines (immunizations).  Do not use any tobacco products, such as cigarettes, chewing tobacco, and e-cigarettes. If you need help quitting, ask your health care provider.  Limit alcohol intake to no more than 2 drinks per day. One drink equals 12 ounces of beer, 5 ounces of [place], or 1 ounces of hard liquor.  Do not use street drugs.  Do not share needles.  Ask your health care provider for help if you need support or information about quitting drugs.  Tell your health care provider if you  often feel depressed.  Tell your health care provider if you have ever been abused or do not feel safe at home. This information is not intended to replace advice given to you by your health care provider. Make sure you discuss any questions you have with your health care provider. Document Released: 05/05/2008 Document Revised: 07/06/2016 Document Reviewed: 08/11/2015 Elsevier Interactive Patient Education  [employer].

## 2018-07-02 ENCOUNTER — Encounter: Payer: Self-pay | Admitting: Endocrinology

## 2018-07-02 ENCOUNTER — Ambulatory Visit: Payer: Medicare Other | Admitting: Endocrinology

## 2018-07-02 VITALS — BP 154/80 | HR 74 | Wt 228.0 lb

## 2018-07-02 DIAGNOSIS — E063 Autoimmune thyroiditis: Secondary | ICD-10-CM

## 2018-07-02 DIAGNOSIS — E041 Nontoxic single thyroid nodule: Secondary | ICD-10-CM

## 2018-07-02 DIAGNOSIS — E038 Other specified hypothyroidism: Secondary | ICD-10-CM | POA: Diagnosis not present

## 2018-07-02 NOTE — Patient Instructions (Addendum)
Your blood pressure is high today.  Please see your primary care provider soon, to have it rechecked Please sign a release of information for the tests from the [facility]. I would be happy to see you back here as needed.

## 2018-07-02 NOTE — Progress Notes (Signed)
Subjective:    Patient ID: Blake Zamora, male    DOB: 13-Aug-1941, 77 y.o.   MRN: [id number]  HPI Pt returns for f/u of hypothyroidism (he had partial thyroidectomy for hyperthyroidism in 1965; he did not require any medication until 1995, when he was started on synthroid for hypothyroidism; US is 2019 showed 2 right-sided nodules). pt states he feels well in general.  He does not notice the goiter.  [facility]--says was benign.   Past Medical History:  Diagnosis Date  . Anxiety   . BPH (benign prostatic hypertrophy)   . Fluid in knee   . Hypothyroidism     Past Surgical History:  Procedure Laterality Date  . BACK SURGERY    . MITOMYCIN C APPLICATION Right 01/10/2014   Procedure: MITOMYCIN C APPLICATION;  Surgeon: Trish FountainPaul V Kowalski, MD;  [facility];  Service: Ophthalmology;  Laterality: Right;  . PTERYGIUM EXCISION Right 01/10/2014   Procedure: PTERYGIUM EXCISION RIGHT EYE WITH AMNIOTIC GRAFT WITH  TISSUE GLUE MITOMYCIN C;  Surgeon: Trish FountainPaul V Kowalski, MD;  [facility];  Service: Ophthalmology;  Laterality: Right;    Social History   Socioeconomic History  . Marital status: Married    Spouse name: Not on file  . Number of children: Not on file  . Years of education: Not on file  . Highest education level: Not on file  Occupational History  . Not on file  Social Needs  . Financial resource strain: Not hard at all  . Food insecurity:    Worry: Never true    Inability: Never true  . Transportation needs:    Medical: No    Non-medical: No  Tobacco Use  . Smoking status: Never Smoker  . Smokeless tobacco: Never Used  Substance and Sexual Activity  . Alcohol use: No  . Drug use: No  . Sexual activity: Yes  Lifestyle  . Physical activity:    Days per week: 0 days    Minutes per session: 0 min  . Stress: Only a little  Relationships  . Social connections:    Talks on phone: More than three times a week    Gets together: More  than three times a week    Attends religious service: More than 4 times per year    Active member of club or organization: Yes    Attends meetings of clubs or organizations: More than 4 times per year    Relationship status: Married  . Intimate partner violence:    Fear of current or ex partner: No    Emotionally abused: No    Physically abused: No    Forced sexual activity: No  Other Topics Concern  . Not on file  Social History Narrative  . Not on file    Current Outpatient Medications on File Prior to Visit  Medication Sig Dispense Refill  . amLODipine (NORVASC) 2.5 MG tablet Take 2.5 mg by mouth daily.    Marland Kitchen. aspirin 81 MG EC tablet Take 81 mg by mouth daily.      . Cholecalciferol (VITAMIN D3) 1000 UNITS tablet Take 1,000 Units by mouth daily.      Marland Kitchen. levothyroxine (SYNTHROID, LEVOTHROID) 112 MCG tablet TAKE 1 TABLET BY MOUTH DAILY 90 tablet 3  . vitamin B-12 (CYANOCOBALAMIN) 1000 MCG tablet Take 1,000 mcg by mouth every other day.      No current facility-administered medications on file prior to visit.  No Known Allergies  Family History  Problem Relation Age of Onset  . Heart disease Mother [id number]       MI  . Cancer Father [id number]       lung  . Thyroid disease Neg Hx     BP (!) 154/80   Pulse 74   Wt 228 lb (103.4 kg)   SpO2 98%   BMI 30.08 kg/m    Review of Systems Denies neck pain.      Objective:   Physical Exam VITAL SIGNS:  See vs page GENERAL: no distress Neck: a healed scar is present.  The 2 cm right thyroid nodule is again noted.        Assessment & Plan:  HTN: is noted today Hypothyroidism: apparently well-replaced Multinodular goiter: we discussed: [facility]  Patient Instructions  Your blood pressure is high today.  Please see your primary care provider soon, to have it rechecked Please sign a release of information for the tests from the [facility]. I would be happy to see you back here as needed.

## 2018-08-14 DIAGNOSIS — H04123 Dry eye syndrome of bilateral lacrimal glands: Secondary | ICD-10-CM | POA: Diagnosis not present

## 2018-08-14 DIAGNOSIS — H40013 Open angle with borderline findings, low risk, bilateral: Secondary | ICD-10-CM | POA: Diagnosis not present

## 2018-08-14 DIAGNOSIS — H2513 Age-related nuclear cataract, bilateral: Secondary | ICD-10-CM | POA: Diagnosis not present

## 2018-08-14 DIAGNOSIS — H11421 Conjunctival edema, right eye: Secondary | ICD-10-CM | POA: Diagnosis not present

## 2018-08-14 DIAGNOSIS — H01021 Squamous blepharitis right upper eyelid: Secondary | ICD-10-CM | POA: Diagnosis not present

## 2019-05-27 ENCOUNTER — Encounter (HOSPITAL_COMMUNITY): Payer: Self-pay

## 2019-05-27 ENCOUNTER — Ambulatory Visit (HOSPITAL_COMMUNITY)
Admission: EM | Admit: 2019-05-27 | Discharge: 2019-05-27 | Disposition: A | Discharge status: Home / Self Care | Payer: Medicare Other | Attending: Family Medicine | Admitting: Family Medicine

## 2019-05-27 ENCOUNTER — Ambulatory Visit: Payer: Self-pay | Admitting: *Deleted

## 2019-05-27 ENCOUNTER — Other Ambulatory Visit: Payer: Self-pay

## 2019-05-27 DIAGNOSIS — W57XXXA Bitten or stung by nonvenomous insect and other nonvenomous arthropods, initial encounter: Secondary | ICD-10-CM

## 2019-05-27 DIAGNOSIS — S30861A Insect bite (nonvenomous) of abdominal wall, initial encounter: Secondary | ICD-10-CM | POA: Diagnosis not present

## 2019-05-27 MED ORDER — DOXYCYCLINE HYCLATE 100 MG PO CAPS: 200.0000 mg | ORAL_CAPSULE | Freq: Once | ORAL | 0 refills | Status: AC

## 2019-05-27 NOTE — Discharge Instructions (Addendum)
You may feel some itchiness or see redness at the site of the bite.  This is not a concern If you develop a large area of redness, rash, fever, or body aches either call the office or your primary care doctor By taking an antibiotic today that should prevent any chance of Lyme disease

## 2019-05-27 NOTE — ED Provider Notes (Signed)
[facility]    CSN: [id number] Arrival date & time: 05/27/19  1808      History   Chief Complaint Chief Complaint  Patient presents with  . Tick Bite    HPI Blake Zamora is a 78 y.o. male.   HPI Patient states that he was in his backyard Saturday for cookout.  Noticed no abnormality on Sunday.  This morning found a tick on his lower abdomen.  Tried to remove with tweezers.  Part of the tick remains attached to his skin.  He called his primary care doctor.  They recommend he come in for evaluation.  Otherwise is well Past Medical History:  Diagnosis Date  . Anxiety   . BPH (benign prostatic hypertrophy)   . Fluid in knee   . Hypothyroidism     Patient Active Problem List   Diagnosis Date Noted  . Thyroid nodule 11/02/2017  . Elevated glucose 07/12/2017  . Knee pain, right 07/12/2017  . LBP (low back pain) 11/28/2013  . Pterygium eye 10/16/2012  . Well adult exam 10/03/2011  . Elevated blood pressure 10/03/2011  . FREQUENCY, URINARY 01/27/2011  . BLURRED VISION 12/30/2010  . DIZZINESS 12/30/2010  . CONCUSSION 12/30/2010  . BENIGN PROSTATIC HYPERTROPHY 09/23/2010  . Hypothyroidism 10/30/2008  . PARESTHESIA 10/30/2008    Past Surgical History:  Procedure Laterality Date  . BACK SURGERY    . MITOMYCIN C APPLICATION Right 01/10/2014   Procedure: MITOMYCIN C APPLICATION;  Surgeon: Trish FountainPaul V Kowalski, MD;  [facility];  Service: Ophthalmology;  Laterality: Right;  . PTERYGIUM EXCISION Right 01/10/2014   Procedure: PTERYGIUM EXCISION RIGHT EYE WITH AMNIOTIC GRAFT WITH  TISSUE GLUE MITOMYCIN C;  Surgeon: Trish FountainPaul V Kowalski, MD;  [facility];  Service: Ophthalmology;  Laterality: Right;       Home Medications    Prior to Admission medications   Medication Sig Start Date End Date Taking? Authorizing Provider  amLODipine (NORVASC) 2.5 MG tablet Take 2.5 mg by mouth daily.    [provider]  aspirin 81 MG EC tablet Take 81 mg by mouth daily.       [provider]  Cholecalciferol (VITAMIN D3) 1000 UNITS tablet Take 1,000 Units by mouth daily.      [provider]  doxycycline (VIBRAMYCIN) 100 MG capsule Take 2 capsules (200 mg total) by mouth once for 1 dose. 05/27/19 05/27/19  Eustace MooreNelson, Lindsay Straka Sue, MD  levothyroxine (SYNTHROID, LEVOTHROID) 112 MCG tablet TAKE 1 TABLET BY MOUTH DAILY 02/22/16   Plotnikov, Georgina QuintAleksei V, MD  vitamin B-12 (CYANOCOBALAMIN) 1000 MCG tablet Take 1,000 mcg by mouth every other day.     [provider]    Family History Family History  Problem Relation Age of Onset  . Heart disease Mother 9189       MI  . Cancer Father 4293       lung  . Thyroid disease Neg Hx     Social History Social History   Tobacco Use  . Smoking status: Never Smoker  . Smokeless tobacco: Never Used  Substance Use Topics  . Alcohol use: No  . Drug use: No     Allergies   Patient has no known allergies.   Review of Systems Review of Systems  Constitutional: Negative for chills and fever.  HENT: Negative for ear pain and sore throat.   Eyes: Negative for pain and visual disturbance.  Respiratory: Negative for cough and shortness of breath.   Cardiovascular: Negative for  chest pain and palpitations.  Gastrointestinal: Negative for abdominal pain and vomiting.  Genitourinary: Negative for dysuria and hematuria.  Musculoskeletal: Negative for arthralgias and back pain.  Skin: Positive for wound. Negative for color change and rash.  Neurological: Negative for seizures and syncope.  All other systems reviewed and are negative.    Physical Exam Triage Vital Signs ED Triage Vitals  Enc Vitals Group     BP 05/27/19 1822 (!) 158/80     Pulse Rate 05/27/19 1822 (!) 59     Resp 05/27/19 1822 17     Temp 05/27/19 1822 98.2 F (36.8 C)     Temp Source 05/27/19 1822 Oral     SpO2 05/27/19 1822 97 %     Weight --      Height --      Head Circumference --      Peak Flow --      Pain Score 05/27/19 1823 0      Pain Loc --      Pain Edu? --      Excl. in GC? --    No data found.  Updated Vital Signs BP (!) 158/80 (BP Location: Right Arm)   Pulse (!) 59   Temp 98.2 F (36.8 C) (Oral)   Resp 17   SpO2 97%        Physical Exam Constitutional:      General: He is not in acute distress.    Appearance: He is well-developed.  HENT:     Head: Normocephalic and atraumatic.  Eyes:     Conjunctiva/sclera: Conjunctivae normal.     Pupils: Pupils are equal, round, and reactive to light.  Neck:     Musculoskeletal: Normal range of motion.  Cardiovascular:     Rate and Rhythm: Normal rate.  Pulmonary:     Effort: Pulmonary effort is normal. No respiratory distress.  Abdominal:     General: There is no distension.     Palpations: Abdomen is soft.  Musculoskeletal: Normal range of motion.  Skin:    General: Skin is warm and dry.          Comments: There is a faint erythema 1 cm round.  In the center of this there is partial tick removal.  The area is cleansed with alcohol.  With forceps I was able to remove the rest of the tick parts.  Neurological:     Mental Status: He is alert.      UC Treatments / Results  Labs (all labs ordered are listed, but only abnormal results are displayed) Labs Reviewed - No data to display  EKG   Radiology No results found.  Procedures Procedures (including critical care time)  Medications Ordered in UC Medications - No data to display  Initial Impression / Assessment and Plan / UC Course  I have reviewed the triage vital signs and the nursing notes.  Pertinent labs & imaging results that were available during my care of the patient were reviewed by me and considered in my medical decision making (see chart for details).      Final Clinical Impressions(s) / UC Diagnoses   Final diagnoses:  Tick bite of abdomen, initial encounter     Discharge Instructions     You may feel some itchiness or see redness at the site of the bite.   This is not a concern If you develop a large area of redness, rash, fever, or body aches either call the office or your primary  care doctor By taking an antibiotic today that should prevent any chance of Lyme disease   ED Prescriptions    Medication Sig Dispense Auth. Provider   doxycycline (VIBRAMYCIN) 100 MG capsule Take 2 capsules (200 mg total) by mouth once for 1 dose. 2 capsule Raylene Everts, MD     Controlled Substance Prescriptions [place] Controlled Substance Registry consulted? Not Applicable   Raylene Everts, MD 05/27/19 [phone]

## 2019-05-27 NOTE — Telephone Encounter (Signed)
Reason for call:  Patient removed a tick located below waist line today, patient removed the head but the body remained and patient seeking clinical advice due to the practice being closed.

## 2019-05-27 NOTE — ED Triage Notes (Signed)
Pt presents with tick bite to left lower abdomen from this weekend; pt tried to retrieve the tick from the area and broke it in half.

## 2019-05-28 NOTE — Telephone Encounter (Signed)
Pt went to UC yesterday 

## 2019-06-03 ENCOUNTER — Ambulatory Visit (HOSPITAL_COMMUNITY)
Admission: EM | Admit: 2019-06-03 | Discharge: 2019-06-03 | Disposition: A | Discharge status: Home / Self Care | Payer: Medicare Other | Attending: Emergency Medicine | Admitting: Emergency Medicine

## 2019-06-03 ENCOUNTER — Other Ambulatory Visit: Payer: Self-pay

## 2019-06-03 ENCOUNTER — Encounter (HOSPITAL_COMMUNITY): Payer: Self-pay

## 2019-06-03 DIAGNOSIS — H60391 Other infective otitis externa, right ear: Secondary | ICD-10-CM

## 2019-06-03 DIAGNOSIS — H6121 Impacted cerumen, right ear: Secondary | ICD-10-CM | POA: Diagnosis not present

## 2019-06-03 MED ORDER — CIPROFLOXACIN HCL 0.2 % OT SOLN: 0.2000 mL | Freq: Two times a day (BID) | OTIC | 0 refills | Status: AC

## 2019-06-03 NOTE — ED Triage Notes (Signed)
Pt presents with right ear fullness and drainage since Thursday; pt states he sent his right hearing aid off for repairs last week and when he received it back Monday he started having issues with his right ear.

## 2019-06-03 NOTE — ED Provider Notes (Signed)
[facility]    CSN: [id number] Arrival date & time: 06/03/19  0845      History   Chief Complaint Chief Complaint  Patient presents with  . Ear Fullness    HPI Blake Zamora is a 78 y.o. male.   Patient presents with right ear fullness x5 days.  He reports drainage on his pillow when he wakes up in the morning.  He denies injury or trauma.  He denies ear pain, fever, chills, sore throat, nausea, vomiting, diarrhea, abdominal pain.    The history is provided by the patient.    Past Medical History:  Diagnosis Date  . Anxiety   . BPH (benign prostatic hypertrophy)   . Fluid in knee   . Hypothyroidism     Patient Active Problem List   Diagnosis Date Noted  . Thyroid nodule 11/02/2017  . Elevated glucose 07/12/2017  . Knee pain, right 07/12/2017  . LBP (low back pain) 11/28/2013  . Pterygium eye 10/16/2012  . Well adult exam 10/03/2011  . Elevated blood pressure 10/03/2011  . FREQUENCY, URINARY 01/27/2011  . BLURRED VISION 12/30/2010  . DIZZINESS 12/30/2010  . CONCUSSION 12/30/2010  . BENIGN PROSTATIC HYPERTROPHY 09/23/2010  . Hypothyroidism 10/30/2008  . PARESTHESIA 10/30/2008    Past Surgical History:  Procedure Laterality Date  . BACK SURGERY    . MITOMYCIN C APPLICATION Right 01/10/2014   Procedure: MITOMYCIN C APPLICATION;  Surgeon: Trish FountainPaul V Kowalski, MD;  [facility];  Service: Ophthalmology;  Laterality: Right;  . PTERYGIUM EXCISION Right 01/10/2014   Procedure: PTERYGIUM EXCISION RIGHT EYE WITH AMNIOTIC GRAFT WITH  TISSUE GLUE MITOMYCIN C;  Surgeon: Trish FountainPaul V Kowalski, MD;  [facility];  Service: Ophthalmology;  Laterality: Right;       Home Medications    Prior to Admission medications   Medication Sig Start Date End Date Taking? Authorizing Provider  amLODipine (NORVASC) 2.5 MG tablet Take 2.5 mg by mouth daily.    [provider]  aspirin 81 MG EC tablet Take 81 mg by mouth daily.      [provider]   Cholecalciferol (VITAMIN D3) 1000 UNITS tablet Take 1,000 Units by mouth daily.      [provider]  levothyroxine (SYNTHROID, LEVOTHROID) 112 MCG tablet TAKE 1 TABLET BY MOUTH DAILY 02/22/16   Plotnikov, Georgina QuintAleksei V, MD  vitamin B-12 (CYANOCOBALAMIN) 1000 MCG tablet Take 1,000 mcg by mouth every other day.     [provider]    Family History Family History  Problem Relation Age of Onset  . Heart disease Mother 2289       MI  . Cancer Father 7293       lung  . Thyroid disease Neg Hx     Social History Social History   Tobacco Use  . Smoking status: Never Smoker  . Smokeless tobacco: Never Used  Substance Use Topics  . Alcohol use: No  . Drug use: No     Allergies   Patient has no known allergies.   Review of Systems Review of Systems  Constitutional: Negative for chills and fever.  HENT: Positive for ear discharge. Negative for congestion, ear pain, rhinorrhea, sinus pressure, sore throat and trouble swallowing.   Eyes: Negative for pain and visual disturbance.  Respiratory: Negative for cough and shortness of breath.   Cardiovascular: Negative for chest pain and palpitations.  Gastrointestinal: Negative for abdominal pain and vomiting.  Genitourinary: Negative for dysuria and hematuria.  Musculoskeletal: Negative for  arthralgias and back pain.  Skin: Negative for color change and rash.  Neurological: Negative for seizures and syncope.  All other systems reviewed and are negative.    Physical Exam Triage Vital Signs ED Triage Vitals  Enc Vitals Group     BP 06/03/19 0912 133/79     Pulse Rate 06/03/19 0912 (!) 58     Resp 06/03/19 0912 16     Temp 06/03/19 0912 97.9 F (36.6 C)     Temp Source 06/03/19 0912 Oral     SpO2 06/03/19 0912 96 %     Weight --      Height --      Head Circumference --      Peak Flow --      Pain Score 06/03/19 0909 3     Pain Loc --      Pain Edu? --      Excl. in GC? --    No data found.  Updated Vital  Signs BP 133/79 (BP Location: Left Arm)   Pulse (!) 58   Temp 97.9 F (36.6 C) (Oral)   Resp 16   SpO2 96%   Visual Acuity Right Eye Distance:   Left Eye Distance:   Bilateral Distance:    Right Eye Near:   Left Eye Near:    Bilateral Near:     Physical Exam Vitals signs and nursing note reviewed.  Constitutional:      Appearance: He is well-developed.  HENT:     Head: Normocephalic and atraumatic.     Right Ear: Tympanic membrane and external ear normal. There is impacted cerumen.     Left Ear: Tympanic membrane, ear canal and external ear normal. There is no impacted cerumen.     Ears:     Comments: After earwax removal, canal erythematous.  TM clear. Eyes:     Conjunctiva/sclera: Conjunctivae normal.  Neck:     Musculoskeletal: Neck supple.  Cardiovascular:     Rate and Rhythm: Normal rate and regular rhythm.  Pulmonary:     Effort: Pulmonary effort is normal. No respiratory distress.     Breath sounds: Normal breath sounds.  Abdominal:     Palpations: Abdomen is soft.     Tenderness: There is no abdominal tenderness.  Skin:    General: Skin is warm and dry.  Neurological:     Mental Status: He is alert.      UC Treatments / Results  Labs (all labs ordered are listed, but only abnormal results are displayed) Labs Reviewed - No data to display  EKG   Radiology No results found.  Procedures Procedures (including critical care time)  Medications Ordered in UC Medications - No data to display  Initial Impression / Assessment and Plan / UC Course  I have reviewed the triage vital signs and the nursing notes.  Pertinent labs & imaging results that were available during my care of the patient were reviewed by me and considered in my medical decision making (see chart for details).   Otitis externa, right ear.  Cerumen impaction, right ear.  Cerumen removed with irrigation by nurse.  Treating today with Cipro eardrops.  Discussed with patient that he  should return here or follow-up with his primary care provider if he develops ear pain, fever, chills, sore throat, nausea, vomiting, diarrhea, abdominal pain, other concerning symptoms.   Final Clinical Impressions(s) / UC Diagnoses   Final diagnoses:  None   Discharge Instructions   None  ED Prescriptions    None     Controlled Substance Prescriptions [place] Controlled Substance Registry consulted? Not Applicable   Sharion Balloon, NP 06/03/19 1030

## 2019-06-03 NOTE — Discharge Instructions (Signed)
Use the antibiotic eardrops twice daily for 7 days.  Follow-up with your primary care provider if your symptoms are not improving.  Return here or follow-up with your primary care provider if you develop ear pain, fever, chills, sore throat, nausea, vomiting, diarrhea, abdominal pain, other concerning symptoms.

## 2019-08-08 ENCOUNTER — Ambulatory Visit (INDEPENDENT_AMBULATORY_CARE_PROVIDER_SITE_OTHER): Payer: Medicare Other | Admitting: *Deleted

## 2019-08-08 DIAGNOSIS — Z0000 Encounter for general adult medical examination without abnormal findings: Secondary | ICD-10-CM

## 2019-08-08 NOTE — Progress Notes (Addendum)
Subjective:   Blake Zamora is a 78 y.o. male who presents for Medicare Annual/Subsequent preventive examination. I connected with patient by a telephone and verified that I am speaking with the correct person using two identifiers. Patient stated full name and DOB. Patient gave permission to continue with telephonic visit. Patient's location was at home and Nurse's location was at [place] office.   Review of Systems:   Cardiac Risk Factors include: advanced age (>52men, >66 women);male gender Sleep patterns: gets up 1 times nightly to void and sleeps 6 hours nightly. Patient reports insomnia issues, discussed recommended sleep tips.    Home Safety/Smoke Alarms: Feels safe in home. Smoke alarms in place.  Living environment; residence and Firearm Safety: 1-story house/ trailer. Lives with wife, no needs for DME, limited support system Seat Belt Safety/Bike Helmet: Wears seat belt.    Objective:    Vitals: There were no vitals taken for this visit.  There is no height or weight on file to calculate BMI.  Advanced Directives 08/08/2019 06/28/2018 06/27/2017 01/06/2014  Does Patient Have a Medical Advance Directive? Yes Yes No Patient has advance directive, copy not in chart  Type of Advance Directive [place];Living will [place];Living will - -  Copy of [place] in Chart? No - copy requested - - -  Would patient like information on creating a medical advance directive? - - Yes (ED - Information included in AVS) -    Tobacco Social History   Tobacco Use  Smoking Status Never Smoker  Smokeless Tobacco Never Used     Counseling given: Not Answered  Past Medical History:  Diagnosis Date  . Anxiety   . BPH (benign prostatic hypertrophy)   . Fluid in knee   . Hypothyroidism    Past Surgical History:  Procedure Laterality Date  . BACK SURGERY    . MITOMYCIN C APPLICATION Right 2/42/6834   Procedure: MITOMYCIN C  APPLICATION;  Surgeon: Abel Presto, MD;  Location: [place];  Service: Ophthalmology;  Laterality: Right;  . PTERYGIUM EXCISION Right 01/10/2014   Procedure: PTERYGIUM EXCISION RIGHT EYE WITH AMNIOTIC GRAFT WITH  TISSUE GLUE MITOMYCIN C;  Surgeon: Abel Presto, MD;  Location: [place];  Service: Ophthalmology;  Laterality: Right;   Family History  Problem Relation Age of Onset  . Heart disease Mother 72       MI  . Cancer Father 38       lung  . Thyroid disease Neg Hx    Social History   Socioeconomic History  . Marital status: Married    Spouse name: Not on file  . Number of children: 0  . Years of education: Not on file  . Highest education level: Not on file  Occupational History  . Occupation: retired  Scientific laboratory technician  . Financial resource strain: Not hard at all  . Food insecurity    Worry: Never true    Inability: Never true  . Transportation needs    Medical: No    Non-medical: No  Tobacco Use  . Smoking status: Never Smoker  . Smokeless tobacco: Never Used  Substance and Sexual Activity  . Alcohol use: No  . Drug use: No  . Sexual activity: Yes  Lifestyle  . Physical activity    Days per week: 1 day    Minutes per session: 30 min  . Stress: Only a little  Relationships  . Social Herbalist on  phone: More than three times a week    Gets together: More than three times a week    Attends religious service: More than 4 times per year    Active member of club or organization: Yes    Attends meetings of clubs or organizations: More than 4 times per year    Relationship status: Married  Other Topics Concern  . Not on file  Social History Narrative  . Not on file    Outpatient Encounter Medications as of 08/08/2019  Medication Sig  . amLODipine (NORVASC) 2.5 MG tablet Take 2.5 mg by mouth daily.  Marland Kitchen Apoaequorin (PREVAGEN PO) Take 1 tablet by mouth.  Marland Kitchen aspirin 81 MG EC tablet Take 81 mg by mouth daily.    . Cholecalciferol (VITAMIN D3) 1000 UNITS  tablet Take 1,000 Units by mouth daily.    Marland Kitchen gabapentin (NEURONTIN) 300 MG capsule Take 300 mg by mouth every other day.  . levothyroxine (SYNTHROID, LEVOTHROID) 112 MCG tablet TAKE 1 TABLET BY MOUTH DAILY  . metFORMIN (GLUCOPHAGE) 500 MG tablet Take 500 mg by mouth every other day.  . Pseudoeph-Doxylamine-DM-APAP (NYQUIL PO) Take 3 mLs by mouth as needed.  . tamsulosin (FLOMAX) 0.4 MG CAPS capsule Take 0.4 mg by mouth daily.  . vitamin B-12 (CYANOCOBALAMIN) 1000 MCG tablet Take 1,000 mcg by mouth every other day.    No facility-administered encounter medications on file as of 08/08/2019.     Activities of Daily Living In your present state of health, do you have any difficulty performing the following activities: 08/08/2019  Hearing? N  Vision? N  Difficulty concentrating or making decisions? N  Walking or climbing stairs? N  Dressing or bathing? N  Doing errands, shopping? N  Preparing Food and eating ? N  Using the Toilet? N  In the past six months, have you accidently leaked urine? N  Do you have problems with loss of bowel control? N  Managing your Medications? N  Managing your Finances? N  Housekeeping or managing your Housekeeping? N  Some recent data might be hidden    Patient Care Team: Plotnikov, Georgina Quint, MD as PCP - General   Assessment:   This is a routine wellness examination for Bryndan. Physical assessment deferred to PCP.  Exercise Activities and Dietary recommendations Current Exercise Habits: Home exercise routine, Type of exercise: walking, Time (Minutes): 30, Frequency (Times/Week): 1, Weekly Exercise (Minutes/Week): 30, Exercise limited by: orthopedic condition(s)  Diet (meal preparation, eat out, water intake, caffeinated beverages, dairy products, fruits and vegetables): in general, a "healthy" diet  , well balanced   Reviewed heart healthy and diabetic diet. Encouraged patient to increase daily water and healthy fluid intake.   Goals    . continue to  be as healthy as possible so I can continue to play golf and fish       Fall Risk Fall Risk  08/08/2019 06/28/2018 06/27/2017  Falls in the past year? 0 No Yes  Number falls in past yr: 0 - 1  Injury with Fall? 0 - No   Depression Screen PHQ 2/9 Scores 08/08/2019 06/28/2018 06/27/2017  PHQ - 2 Score 1 0 0  PHQ- 9 Score - - 3    Cognitive Function MMSE - Mini Mental State Exam 06/28/2018  Orientation to time 5  Orientation to Place 5  Registration 3  Attention/ Calculation 5  Recall 3  Language- name 2 objects 2  Language- repeat 1  Language- follow 3 step command 3  Language-  read & follow direction 1  Write a sentence 1  Copy design 1  Total score 30       Ad8 score reviewed for issues:  Issues making decisions: no  Less interest in hobbies / activities: no  Repeats questions, stories (family complaining): no  Trouble using ordinary gadgets (microwave, computer, phone):no  Forgets the month or year: no  Mismanaging finances: no  Remembering appts: no  Daily problems with thinking and/or memory: no Ad8 score is= 0  Immunization History  Administered Date(s) Administered  . H1N1 10/30/2008  . Influenza Whole 11/30/2009  . Influenza, High Dose Seasonal PF 11/25/2016, 09/07/2017  . Influenza, Seasonal, Injecte, Preservative Fre 10/16/2012  . Influenza,inj,Quad PF,6+ Mos 11/28/2013  . Pneumococcal Polysaccharide-23 09/23/2010  . Td 11/21/2010  . Zoster 11/21/2013   Screening Tests Health Maintenance  Topic Date Due  . PNA vac Low Risk Adult (2 of 2 - PCV13) 09/24/2011  . INFLUENZA VACCINE  06/22/2019  . COLONOSCOPY  09/23/2019  . TETANUS/TDAP  11/21/2020       Plan:    Reviewed health maintenance screenings with patient today and relevant education, vaccines, and/or referrals were provided.   I have personally reviewed and noted the following in the patient's chart:   . Medical and social history . Use of alcohol, tobacco or illicit drugs  . Current  medications and supplements . Functional ability and status . Nutritional status . Physical activity . Advanced directives . List of other physicians . Screenings to include cognitive, depression, and falls . Referrals and appointments  In addition, I have reviewed and discussed with patient certain preventive protocols, quality metrics, and best practice recommendations. A written personalized care plan for preventive services as well as general preventive health recommendations were provided to patient.     Wanda PlumpJill A Wine, RN  08/08/2019  Medical screening examination/treatment/procedure(s) were performed by non-physician practitioner and as supervising physician I was immediately available for consultation/collaboration. I agree with above. Jacinta ShoeAleksei Plotnikov, MD

## 2019-08-08 NOTE — Patient Instructions (Addendum)
The [facility] provides information and referral services to aging adults 65+ in [place]. If there are waiting lists for community services, or if services are not available, [facility] connects clients with [facility] volunteers close to them who provide services such as respite care, wheelchair ramp construction, friendly visits, and transportation assistance. [facility]'s Call Center fields more than 350 calls each month.  AAIRS*- and SHIIP*-certified Call Center Specialists are ready to lend a compassionate ear and seek resources Monday through Friday, 9:00 am - 5:00 pm. The Call Center has met the needs of aging adults in all of [place] 100 counties. No religious affiliation is required; the only eligibility criterion is that clients be 65+ or older. Contact [facility] for help. *[facility] Information and Referral Systems *Seniors' Health Insurance Information Program Need help? Call [phone] today!  Just1Navigator - A Free Guide To Your Care Strategy As people age and find they need some extra help and care, they - or their families - often don't know what the best approach to care is for their situation. Is transportation needed? Or would it be helpful to have someone assist with errand-running? What about challenges with bathing or cooking? Is an adult day program ideal, or would home care be the best solution?  This decision-making process can often prove confusing and very emotional. That's why [facility] created the Just1Navigator program, a free service to help individuals and families through the journey of determining care for older adults.  The "Navigator" is a Education officer, museum, who will meet with a prospective client and/or loved ones to provide an assessment of the situation and a set of recommendations for a personalized care plan - all free of charge, and whether [facility] offers the needed service or not. If the need is not a service we provide, we are  well-connected with reputable programs in town that we can refer you to.  To speak with the Navigator - Arnell Asal - call [phone].  Continue to eat heart healthy diet (full of fruits, vegetables, whole grains, lean protein, water--limit salt, fat, and sugar intake) and increase physical activity as tolerated.  Continue doing brain stimulating activities (puzzles, reading, adult coloring books, staying active) to keep memory sharp.    Blake Zamora , Thank you for taking time to come for your Medicare Wellness Visit. I appreciate your ongoing commitment to your health goals. Please review the following plan we discussed and let me know if I can assist you in the future.   These are the goals we discussed: Goals    . continue to be as healthy as possible so I can continue to play golf and fish       This is a list of the screening recommended for you and due dates:  Health Maintenance  Topic Date Due  . Pneumonia vaccines (2 of 2 - PCV13) 09/24/2011  . Flu Shot  02/20/2020*  . Colon Cancer Screening  09/23/2019  . Tetanus Vaccine  11/21/2020  *Topic was postponed. The date shown is not the original due date.    Preventive Care 65 Years and Older, Male Preventive care refers to lifestyle choices and visits with your health care provider that can promote health and wellness. This includes:  A yearly physical exam. This is also called an annual well check.  Regular dental and eye exams.  Immunizations.  Screening for certain conditions.  Healthy lifestyle choices, such as diet and exercise. What can I expect for my preventive care  visit? Physical exam Your health care provider will check:  Height and weight. These may be used to calculate body mass index (BMI), which is a measurement that tells if you are at a healthy weight.  Heart rate and blood pressure.  Your skin for abnormal spots. Counseling Your health care provider may ask you questions about:  Alcohol,  tobacco, and drug use.  Emotional well-being.  Home and relationship well-being.  Sexual activity.  Eating habits.  History of falls.  Memory and ability to understand (cognition).  Work and work Statistician. What immunizations do I need?  Influenza (flu) vaccine  This is recommended every year. Tetanus, diphtheria, and pertussis (Tdap) vaccine  You may need a Td booster every 10 years. Varicella (chickenpox) vaccine  You may need this vaccine if you have not already been vaccinated. Zoster (shingles) vaccine  You may need this after age 29. Pneumococcal conjugate (PCV13) vaccine  One dose is recommended after age 57. Pneumococcal polysaccharide (PPSV23) vaccine  One dose is recommended after age 100. Measles, mumps, and rubella (MMR) vaccine  You may need at least one dose of MMR if you were born in 1957 or later. You may also need a second dose. Meningococcal conjugate (MenACWY) vaccine  You may need this if you have certain conditions. Hepatitis A vaccine  You may need this if you have certain conditions or if you travel or work in places where you may be exposed to hepatitis A. Hepatitis B vaccine  You may need this if you have certain conditions or if you travel or work in places where you may be exposed to hepatitis B. Haemophilus influenzae type b (Hib) vaccine  You may need this if you have certain conditions. You may receive vaccines as individual doses or as more than one vaccine together in one shot (combination vaccines). Talk with your health care provider about the risks and benefits of combination vaccines. What tests do I need? Blood tests  Lipid and cholesterol levels. These may be checked every 5 years, or more frequently depending on your overall health.  Hepatitis C test.  Hepatitis B test. Screening  Lung cancer screening. You may have this screening every year starting at age 37 if you have a 30-pack-year history of smoking and  currently smoke or have quit within the past 15 years.  Colorectal cancer screening. All adults should have this screening starting at age 60 and continuing until age 63. Your health care provider may recommend screening at age 41 if you are at increased risk. You will have tests every 1-10 years, depending on your results and the type of screening test.  Prostate cancer screening. Recommendations will vary depending on your family history and other risks.  Diabetes screening. This is done by checking your blood sugar (glucose) after you have not eaten for a while (fasting). You may have this done every 1-3 years.  Abdominal aortic aneurysm (AAA) screening. You may need this if you are a current or former smoker.  Sexually transmitted disease (STD) testing. Follow these instructions at home: Eating and drinking  Eat a diet that includes fresh fruits and vegetables, whole grains, lean protein, and low-fat dairy products. Limit your intake of foods with high amounts of sugar, saturated fats, and salt.  Take vitamin and mineral supplements as recommended by your health care provider.  Do not drink alcohol if your health care provider tells you not to drink.  If you drink alcohol: ? Limit how much you have to  0-2 drinks a day. ? Be aware of how much alcohol is in your drink. In the [place] one drink equals one 12 oz bottle of beer (355 mL), one 5 oz glass of Tameya Kuznia (148 mL), or one 1 oz glass of hard liquor (44 mL). Lifestyle  Take daily care of your teeth and gums.  Stay active. Exercise for at least 30 minutes on 5 or more days each week.  Do not use any products that contain nicotine or tobacco, such as cigarettes, e-cigarettes, and chewing tobacco. If you need help quitting, ask your health care provider.  If you are sexually active, practice safe sex. Use a condom or other form of protection to prevent STIs (sexually transmitted infections).  Talk with your health care provider about  taking a low-dose aspirin or statin. What's next?  Visit your health care provider once a year for a well check visit.  Ask your health care provider how often you should have your eyes and teeth checked.  Stay up to date on all vaccines. This information is not intended to replace advice given to you by your health care provider. Make sure you discuss any questions you have with your health care provider. Document Released: 12/04/2015 Document Revised: 11/01/2018 Document Reviewed: 11/01/2018 Elsevier Patient Education  2020 [employer].

## 2019-10-23 ENCOUNTER — Encounter: Payer: Self-pay | Admitting: Internal Medicine

## 2019-10-28 DIAGNOSIS — M25561 Pain in right knee: Secondary | ICD-10-CM | POA: Diagnosis not present

## 2019-11-05 DIAGNOSIS — M25561 Pain in right knee: Secondary | ICD-10-CM | POA: Diagnosis not present

## 2019-11-11 DIAGNOSIS — M1711 Unilateral primary osteoarthritis, right knee: Secondary | ICD-10-CM | POA: Diagnosis not present

## 2019-11-11 DIAGNOSIS — M25561 Pain in right knee: Secondary | ICD-10-CM | POA: Diagnosis not present

## 2019-12-17 ENCOUNTER — Encounter (HOSPITAL_COMMUNITY): Payer: Self-pay

## 2019-12-17 ENCOUNTER — Other Ambulatory Visit: Payer: Self-pay

## 2019-12-17 ENCOUNTER — Ambulatory Visit (HOSPITAL_COMMUNITY)
Admission: EM | Admit: 2019-12-17 | Discharge: 2019-12-17 | Disposition: A | Discharge status: Home / Self Care | Payer: Medicare Other | Attending: Family Medicine | Admitting: Family Medicine

## 2019-12-17 DIAGNOSIS — R112 Nausea with vomiting, unspecified: Secondary | ICD-10-CM | POA: Diagnosis not present

## 2019-12-17 MED ORDER — ONDANSETRON 4 MG PO TBDP: 4.0000 mg | ORAL_TABLET | Freq: Three times a day (TID) | ORAL | 0 refills | Status: DC | PRN

## 2019-12-17 NOTE — ED Triage Notes (Signed)
Pt states he has having stomach pain. Pt states it feels a little better.

## 2019-12-17 NOTE — Discharge Instructions (Addendum)

## 2019-12-18 ENCOUNTER — Telehealth: Payer: Self-pay

## 2019-12-18 NOTE — Telephone Encounter (Signed)
Dally with [facility] in [place] calling and states that patient was prescribed ondansetron (ZOFRAN-ODT) 4 MG disintegrating tablet Recently and when she tried to run it though insurance it said the patient was a chemo patient and to discuss with PCP. Patient was seen at the urgent care yesterday 12/17/2019. Please advise. CB#: [phone]

## 2019-12-18 NOTE — Telephone Encounter (Signed)
Pharmacy informed that we have not seen the pt since 2018. They states this call was about coverage but pt found a discount card and received RX

## 2019-12-18 NOTE — ED Provider Notes (Signed)
[facility]   [id number] 12/17/19 Arrival Time: 1249  ASSESSMENT & PLAN:  1. Non-intractable vomiting with nausea, unspecified vomiting type     Vomiting has resolved. Just mild nausea now. Tolerating PO fluids.  Meds ordered this encounter  Medications  . ondansetron (ZOFRAN-ODT) 4 MG disintegrating tablet    Sig: Take 1 tablet (4 mg total) by mouth every 8 (eight) hours as needed for nausea or vomiting.    Dispense:  15 tablet    Refill:  0   Benign abd exam. No indication for abdominal imaging. No signs of dehydration requiring IVF. Will do his best to ensure adequate fluid intake in order to avoid dehydration. Will proceed to the Emergency Department for evaluation if unable to tolerate PO fluids regularly.  Otherwise he will f/u with his PCP or here if not showing improvement over the next 48-72 hours.  Reviewed expectations re: course of current medical issues. Questions answered. Outlined signs and symptoms indicating need for more acute intervention. Patient verbalized understanding. After Visit Summary given.   SUBJECTIVE: History from: patient.  Blake Zamora is a 79 y.o. male who presents with complaint of non-bilious, non-bloody intermittent n/v without non-bloody diarrhea. Onset approx 48 hours ago. Abdominal discomfort: mild and cramping. Symptoms are gradually improving since beginning. Aggravating factors: eating. Alleviating factors: none. Associated symptoms: mild fatigue. He denies arthralgias, belching, constipation, fever, headache and myalgias. Appetite: decreased. PO intake: decreased. Ambulatory without assistance. Urinary symptoms: none. Sick contacts: none. Recent travel or camping: none. OTC treatment: none.    Past Surgical History:  Procedure Laterality Date  . BACK SURGERY    . MITOMYCIN C APPLICATION Right 01/10/2014   Procedure: MITOMYCIN C APPLICATION;  Surgeon: Trish Fountain, MD;  Location: [facility] OR;  Service: Ophthalmology;   Laterality: Right;  . PTERYGIUM EXCISION Right 01/10/2014   Procedure: PTERYGIUM EXCISION RIGHT EYE WITH AMNIOTIC GRAFT WITH  TISSUE GLUE MITOMYCIN C;  Surgeon: Trish Fountain, MD;  Location: [facility] OR;  Service: Ophthalmology;  Laterality: Right;     OBJECTIVE:  Vitals:   12/17/19 1413 12/17/19 1414  BP:  139/74  Pulse:  86  Resp:  18  Temp:  98.5 F (36.9 C)  TempSrc:  Oral  SpO2:  100%  Weight: 106.6 kg     General appearance: alert; no distress Oropharynx: moist Lungs: clear to auscultation bilaterally; unlabored Heart: regular rate and rhythm Abdomen: soft; non-distended; no significant abdominal tenderness; bowel sounds nl; no masses or organomegaly; no guarding or rebound tenderness Back: no CVA tenderness Extremities: no edema; symmetrical with no gross deformities Skin: warm; dry Neurologic: normal gait Psychological: alert and cooperative; normal mood and affect   No Known Allergies                                             Past Medical History:  Diagnosis Date  . Anxiety   . BPH (benign prostatic hypertrophy)   . Fluid in knee   . Hypothyroidism    Social History   Socioeconomic History  . Marital status: Married    Spouse name: Not on file  . Number of children: 0  . Years of education: Not on file  . Highest education level: Not on file  Occupational History  . Occupation: retired  Tobacco Use  . Smoking status: Never Smoker  . Smokeless tobacco: Never Used  Substance and Sexual Activity  . Alcohol use: No  . Drug use: No  . Sexual activity: Yes  Other Topics Concern  . Not on file  Social History Narrative  . Not on file   Social Determinants of Health   Financial Resource Strain:   . Difficulty of Paying Living Expenses: Not on file  Food Insecurity:   . Worried About Charity fundraiser in the Last Year: Not on file  . Ran Out of Food in the Last Year: Not on file  Transportation Needs:   . Lack of Transportation (Medical): Not on  file  . Lack of Transportation (Non-Medical): Not on file  Physical Activity: Insufficiently Active  . Days of Exercise per Week: 1 day  . Minutes of Exercise per Session: 30 min  Stress:   . Feeling of Stress : Not on file  Social Connections:   . Frequency of Communication with Friends and Family: Not on file  . Frequency of Social Gatherings with Friends and Family: Not on file  . Attends Religious Services: Not on file  . Active Member of Clubs or Organizations: Not on file  . Attends Archivist Meetings: Not on file  . Marital Status: Not on file  Intimate Partner Violence:   . Fear of Current or Ex-Partner: Not on file  . Emotionally Abused: Not on file  . Physically Abused: Not on file  . Sexually Abused: Not on file   Family History  Problem Relation Age of Onset  . Heart disease Mother 17       MI  . Cancer Father 25       lung  . Thyroid disease Neg Hx      Vanessa Kick, MD 12/18/19 [phone]

## 2019-12-25 DIAGNOSIS — M1711 Unilateral primary osteoarthritis, right knee: Secondary | ICD-10-CM | POA: Diagnosis not present

## 2019-12-25 DIAGNOSIS — M25561 Pain in right knee: Secondary | ICD-10-CM | POA: Diagnosis not present

## 2019-12-26 DIAGNOSIS — M1711 Unilateral primary osteoarthritis, right knee: Secondary | ICD-10-CM | POA: Diagnosis not present

## 2019-12-26 DIAGNOSIS — M25561 Pain in right knee: Secondary | ICD-10-CM | POA: Diagnosis not present

## 2020-01-02 DIAGNOSIS — M25561 Pain in right knee: Secondary | ICD-10-CM | POA: Diagnosis not present

## 2020-01-02 DIAGNOSIS — M1711 Unilateral primary osteoarthritis, right knee: Secondary | ICD-10-CM | POA: Diagnosis not present

## 2020-02-06 DIAGNOSIS — M170 Bilateral primary osteoarthritis of knee: Secondary | ICD-10-CM | POA: Diagnosis not present

## 2020-03-05 DIAGNOSIS — M1711 Unilateral primary osteoarthritis, right knee: Secondary | ICD-10-CM | POA: Diagnosis not present

## 2020-03-12 DIAGNOSIS — M1711 Unilateral primary osteoarthritis, right knee: Secondary | ICD-10-CM | POA: Diagnosis not present

## 2020-03-19 DIAGNOSIS — M1711 Unilateral primary osteoarthritis, right knee: Secondary | ICD-10-CM | POA: Diagnosis not present

## 2020-04-30 DIAGNOSIS — M25561 Pain in right knee: Secondary | ICD-10-CM | POA: Diagnosis not present

## 2020-04-30 DIAGNOSIS — M1711 Unilateral primary osteoarthritis, right knee: Secondary | ICD-10-CM | POA: Diagnosis not present

## 2020-05-26 ENCOUNTER — Telehealth: Payer: Self-pay | Admitting: Internal Medicine

## 2020-05-26 DIAGNOSIS — Z0000 Encounter for general adult medical examination without abnormal findings: Secondary | ICD-10-CM

## 2020-05-26 NOTE — Telephone Encounter (Signed)
    Patient requesting order for annual labs, prior to upcoming appointment

## 2020-05-27 NOTE — Telephone Encounter (Signed)
Notified pt MD approved labs. Made lab appt for 06/09/20...Raechel Chute

## 2020-05-27 NOTE — Telephone Encounter (Signed)
Ok Done Thx 

## 2020-06-09 ENCOUNTER — Other Ambulatory Visit: Payer: Self-pay

## 2020-06-09 ENCOUNTER — Other Ambulatory Visit (INDEPENDENT_AMBULATORY_CARE_PROVIDER_SITE_OTHER): Payer: Medicare Other

## 2020-06-09 DIAGNOSIS — Z0000 Encounter for general adult medical examination without abnormal findings: Secondary | ICD-10-CM | POA: Diagnosis not present

## 2020-06-09 LAB — CBC WITH DIFFERENTIAL/PLATELET
Basophils Absolute: 0 10*3/uL (ref 0.0–0.1)
Basophils Relative: 0.6 % (ref 0.0–3.0)
Eosinophils Absolute: 0.2 10*3/uL (ref 0.0–0.7)
Eosinophils Relative: 5.6 % — ABNORMAL HIGH (ref 0.0–5.0)
HCT: 35.7 % — ABNORMAL LOW (ref 39.0–52.0)
Hemoglobin: 12.1 g/dL — ABNORMAL LOW (ref 13.0–17.0)
Lymphocytes Relative: 38.5 % (ref 12.0–46.0)
Lymphs Abs: 1.5 10*3/uL (ref 0.7–4.0)
MCHC: 33.8 g/dL (ref 30.0–36.0)
MCV: 85.8 fl (ref 78.0–100.0)
Monocytes Absolute: 0.6 10*3/uL (ref 0.1–1.0)
Monocytes Relative: 16.9 % — ABNORMAL HIGH (ref 3.0–12.0)
Neutro Abs: 1.4 10*3/uL (ref 1.4–7.7)
Neutrophils Relative %: 38.4 % — ABNORMAL LOW (ref 43.0–77.0)
Platelets: 164 10*3/uL (ref 150.0–400.0)
RBC: 4.16 Mil/uL — ABNORMAL LOW (ref 4.22–5.81)
RDW: 14.8 % (ref 11.5–15.5)
WBC: 3.8 10*3/uL — ABNORMAL LOW (ref 4.0–10.5)

## 2020-06-09 LAB — URINALYSIS
Bilirubin Urine: NEGATIVE
Ketones, ur: NEGATIVE
Leukocytes,Ua: NEGATIVE
Nitrite: NEGATIVE
Specific Gravity, Urine: 1.02 (ref 1.000–1.030)
Total Protein, Urine: NEGATIVE
Urine Glucose: NEGATIVE
Urobilinogen, UA: 0.2 (ref 0.0–1.0)
pH: 6 (ref 5.0–8.0)

## 2020-06-09 LAB — LIPID PANEL
Cholesterol: 112 mg/dL (ref 0–200)
HDL: 29.1 mg/dL — ABNORMAL LOW (ref >39.00)
LDL Cholesterol: 62 mg/dL (ref 0–99)
NonHDL: 82.69
Total CHOL/HDL Ratio: 4
Triglycerides: 105 mg/dL (ref 0.0–149.0)
VLDL: 21 mg/dL (ref 0.0–40.0)

## 2020-06-09 LAB — BASIC METABOLIC PANEL
BUN: 15 mg/dL (ref 6–23)
CO2: 27 mEq/L (ref 19–32)
Calcium: 8.9 mg/dL (ref 8.4–10.5)
Chloride: 106 mEq/L (ref 96–112)
Creatinine, Ser: 0.84 mg/dL (ref 0.40–1.50)
GFR: 107.3 mL/min (ref >60.00)
Glucose, Bld: 103 mg/dL — ABNORMAL HIGH (ref 70–99)
Potassium: 3.9 mEq/L (ref 3.5–5.1)
Sodium: 138 mEq/L (ref 135–145)

## 2020-06-09 LAB — HEPATIC FUNCTION PANEL
ALT: 15 U/L (ref 0–53)
AST: 18 U/L (ref 0–37)
Albumin: 4.1 g/dL (ref 3.5–5.2)
Alkaline Phosphatase: 49 U/L (ref 39–117)
Bilirubin, Direct: 0.1 mg/dL (ref 0.0–0.3)
Total Bilirubin: 0.5 mg/dL (ref 0.2–1.2)
Total Protein: 6.7 g/dL (ref 6.0–8.3)

## 2020-06-09 LAB — PSA: PSA: 0.93 ng/mL (ref 0.10–4.00)

## 2020-06-09 LAB — TSH: TSH: 0.86 u[IU]/mL (ref 0.35–4.50)

## 2020-06-15 ENCOUNTER — Ambulatory Visit (INDEPENDENT_AMBULATORY_CARE_PROVIDER_SITE_OTHER): Payer: Medicare Other | Admitting: Internal Medicine

## 2020-06-15 ENCOUNTER — Encounter: Payer: Self-pay | Admitting: Internal Medicine

## 2020-06-15 ENCOUNTER — Ambulatory Visit (INDEPENDENT_AMBULATORY_CARE_PROVIDER_SITE_OTHER): Payer: Medicare Other

## 2020-06-15 ENCOUNTER — Other Ambulatory Visit: Payer: Self-pay

## 2020-06-15 DIAGNOSIS — R059 Cough, unspecified: Secondary | ICD-10-CM | POA: Insufficient documentation

## 2020-06-15 DIAGNOSIS — E038 Other specified hypothyroidism: Secondary | ICD-10-CM

## 2020-06-15 DIAGNOSIS — R05 Cough: Secondary | ICD-10-CM

## 2020-06-15 DIAGNOSIS — E063 Autoimmune thyroiditis: Secondary | ICD-10-CM | POA: Diagnosis not present

## 2020-06-15 DIAGNOSIS — K635 Polyp of colon: Secondary | ICD-10-CM

## 2020-06-15 DIAGNOSIS — Z0000 Encounter for general adult medical examination without abnormal findings: Secondary | ICD-10-CM

## 2020-06-15 NOTE — Assessment & Plan Note (Signed)
CXR

## 2020-06-15 NOTE — Assessment & Plan Note (Signed)
Ref to see Dr Marina Goodell Last colon 2011 - Dr Marina Goodell

## 2020-06-15 NOTE — Assessment & Plan Note (Signed)
TSH was nl

## 2020-06-15 NOTE — Assessment & Plan Note (Signed)
  We discussed age appropriate health related issues, including available/recomended screening tests and vaccinations. Labs were ordered to be later reviewed . All questions were answered. We discussed one or more of the following - seat belt use, use of sunscreen/sun exposure exercise, safe sex, fall risk reduction, second hand smoke exposure, firearm use and storage, seat belt use, a need for adhering to healthy diet and exercise. Labs were  discussed. All questions were answered.  Last colon 2011 - Dr Marina Goodell

## 2020-06-15 NOTE — Progress Notes (Signed)
Subjective:  Patient ID: Blake Zamora, male    DOB: 14-Feb-1941  Age: 79 y.o. MRN: [id number]  CC: No chief complaint on file.   HPI Blake Zamora presents for a well exam C/o cough - mild for months  Outpatient Medications Prior to Visit  Medication Sig Dispense Refill  . amLODipine (NORVASC) 2.5 MG tablet Take 2.5 mg by mouth daily.    Marland Kitchen Apoaequorin (PREVAGEN PO) Take 1 tablet by mouth.    Marland Kitchen aspirin 81 MG EC tablet Take 81 mg by mouth daily.      . Cholecalciferol (VITAMIN D3) 1000 UNITS tablet Take 1,000 Units by mouth daily.      Marland Kitchen gabapentin (NEURONTIN) 300 MG capsule Take 300 mg by mouth every other day.    . levothyroxine (SYNTHROID, LEVOTHROID) 112 MCG tablet TAKE 1 TABLET BY MOUTH DAILY 90 tablet 3  . metFORMIN (GLUCOPHAGE) 500 MG tablet Take 500 mg by mouth every other day.    . ondansetron (ZOFRAN-ODT) 4 MG disintegrating tablet Take 1 tablet (4 mg total) by mouth every 8 (eight) hours as needed for nausea or vomiting. 15 tablet 0  . Pseudoeph-Doxylamine-DM-APAP (NYQUIL PO) Take 3 mLs by mouth as needed.    . tamsulosin (FLOMAX) 0.4 MG CAPS capsule Take 0.4 mg by mouth daily.    . vitamin B-12 (CYANOCOBALAMIN) 1000 MCG tablet Take 1,000 mcg by mouth every 3 (three) days.      No facility-administered medications prior to visit.    ROS: Review of Systems  Constitutional: Negative for appetite change, fatigue and unexpected weight change.  HENT: Negative for congestion, nosebleeds, sneezing, sore throat and trouble swallowing.   Eyes: Negative for itching and visual disturbance.  Respiratory: Positive for cough.   Cardiovascular: Negative for chest pain, palpitations and leg swelling.  Gastrointestinal: Negative for abdominal distention, blood in stool, diarrhea and nausea.  Genitourinary: Negative for frequency and hematuria.  Musculoskeletal: Negative for back pain, gait problem, joint swelling and neck pain.  Skin: Negative for rash.  Neurological: Negative for  dizziness, tremors, speech difficulty and weakness.  Psychiatric/Behavioral: Negative for agitation, dysphoric mood and sleep disturbance. The patient is not nervous/anxious.     Objective:  BP (!) 120/64 (BP Location: Left Arm, Patient Position: Sitting, Cuff Size: Large)   Pulse 49   Temp 98.4 F (36.9 C) (Oral)   Ht 6\' 1"  (1.854 m)   Wt (!) 228 lb (103.4 kg)   SpO2 96%   BMI 30.08 kg/m   BP Readings from Last 3 Encounters:  06/15/20 (!) 120/64  12/17/19 139/74  06/03/19 133/79    Wt Readings from Last 3 Encounters:  06/15/20 (!) 228 lb (103.4 kg)  12/17/19 235 lb (106.6 kg)  07/02/18 228 lb (103.4 kg)    Physical Exam Constitutional:      General: He is not in acute distress.    Appearance: He is well-developed.     Comments: NAD  Eyes:     Conjunctiva/sclera: Conjunctivae normal.     Pupils: Pupils are equal, round, and reactive to light.  Neck:     Thyroid: No thyromegaly.     Vascular: No JVD.  Cardiovascular:     Rate and Rhythm: Normal rate and regular rhythm.     Heart sounds: Normal heart sounds. No murmur heard.  No friction rub. No gallop.   Pulmonary:     Effort: Pulmonary effort is normal. No respiratory distress.     Breath sounds: Normal breath sounds. No  wheezing or rales.  Chest:     Chest wall: No tenderness.  Abdominal:     General: Bowel sounds are normal. There is no distension.     Palpations: Abdomen is soft. There is no mass.     Tenderness: There is no abdominal tenderness. There is no guarding or rebound.  Musculoskeletal:        General: No tenderness. Normal range of motion.     Cervical back: Normal range of motion.  Lymphadenopathy:     Cervical: No cervical adenopathy.  Skin:    General: Skin is warm and dry.     Findings: No rash.  Neurological:     Mental Status: He is alert.     Cranial Nerves: No cranial nerve deficit.     Motor: No abnormal muscle tone.     Coordination: Coordination normal.     Gait: Gait normal.      Deep Tendon Reflexes: Reflexes are normal and symmetric.  Psychiatric:        Behavior: Behavior normal.        Thought Content: Thought content normal.        Judgment: Judgment normal.     Lab Results  Component Value Date   WBC 3.8 (L) 06/09/2020   HGB 12.1 (L) 06/09/2020   HCT 35.7 (L) 06/09/2020   PLT 164.0 06/09/2020   GLUCOSE 103 (H) 06/09/2020   CHOL 112 06/09/2020   TRIG 105.0 06/09/2020   HDL 29.10 (L) 06/09/2020   LDLCALC 62 06/09/2020   ALT 15 06/09/2020   AST 18 06/09/2020   NA 138 06/09/2020   K 3.9 06/09/2020   CL 106 06/09/2020   CREATININE 0.84 06/09/2020   BUN 15 06/09/2020   CO2 27 06/09/2020   TSH 0.86 06/09/2020   PSA 0.93 06/09/2020   HGBA1C 6.4 07/12/2017    No results found.  Assessment & Plan:   There are no diagnoses linked to this encounter.   No orders of the defined types were placed in this encounter.    Follow-up: No follow-ups on file.  Sonda Primes, MD

## 2020-06-23 ENCOUNTER — Encounter: Payer: Self-pay | Admitting: Internal Medicine

## 2020-08-13 ENCOUNTER — Other Ambulatory Visit: Payer: Self-pay

## 2020-08-13 ENCOUNTER — Ambulatory Visit (INDEPENDENT_AMBULATORY_CARE_PROVIDER_SITE_OTHER): Payer: Medicare Other

## 2020-08-13 VITALS — BP 150/80 | HR 58 | Temp 98.2°F | Resp 16 | Ht 73.0 in | Wt 231.6 lb

## 2020-08-13 DIAGNOSIS — Z0000 Encounter for general adult medical examination without abnormal findings: Secondary | ICD-10-CM

## 2020-08-13 NOTE — Patient Instructions (Signed)
[name] , Thank you for taking time to come for your Medicare Wellness Visit. I appreciate your ongoing commitment to your health goals. Please review the following plan we discussed and let me know if I can assist you in the future.   Screening recommendations/referrals: Colonoscopy: never done Recommended yearly ophthalmology/optometry visit for glaucoma screening and checkup Recommended yearly dental visit for hygiene and checkup  Vaccinations: Influenza vaccine: overdue Pneumococcal vaccine: not completed Tdap vaccine: due 2022 Shingles vaccine: never done   Covid-19: completed  Advanced directives: Please bring a copy of your health care power of attorney and living will to the office at your convenience.  Conditions/risks identified: Yes; Reviewed health maintenance screenings with patient today and relevant education, vaccines, and/or referrals were provided. Please continue to do your personal lifestyle choices by: daily care of teeth and gums, regular physical activity (goal should be 5 days a week for 30 minutes), eat a healthy diet, avoid tobacco and drug use, limiting any alcohol intake, taking a low-dose aspirin (if not allergic or have been advised by your provider otherwise) and taking vitamins and minerals as recommended by your provider. Continue doing brain stimulating activities (puzzles, reading, adult coloring books, staying active) to keep memory sharp. Continue to eat heart healthy diet (full of fruits, vegetables, whole grains, lean protein, water--limit salt, fat, and sugar intake) and increase physical activity as tolerated.  Next appointment: Please schedule your next Medicare Wellness Visit with your Nurse Health Advisor in 1 year.  Preventive Care 17 Years and Older, Male Preventive care refers to lifestyle choices and visits with your health care provider that can promote health and wellness. What does preventive care include?  A yearly physical exam. This is  also called an annual well check.  Dental exams once or twice a year.  Routine eye exams. Ask your health care provider how often you should have your eyes checked.  Personal lifestyle choices, including:  Daily care of your teeth and gums.  Regular physical activity.  Eating a healthy diet.  Avoiding tobacco and drug use.  Limiting alcohol use.  Practicing safe sex.  Taking low doses of aspirin every day.  Taking vitamin and mineral supplements as recommended by your health care provider. What happens during an annual well check? The services and screenings done by your health care provider during your annual well check will depend on your age, overall health, lifestyle risk factors, and family history of disease. Counseling  Your health care provider may ask you questions about your:  Alcohol use.  Tobacco use.  Drug use.  Emotional well-being.  Home and relationship well-being.  Sexual activity.  Eating habits.  History of falls.  Memory and ability to understand (cognition).  Work and work Astronomer. Screening  You may have the following tests or measurements:  Height, weight, and BMI.  Blood pressure.  Lipid and cholesterol levels. These may be checked every 5 years, or more frequently if you are over 47 years old.  Skin check.  Lung cancer screening. You may have this screening every year starting at age 58 if you have a 30-pack-year history of smoking and currently smoke or have quit within the past 15 years.  Fecal occult blood test (FOBT) of the stool. You may have this test every year starting at age 75.  Flexible sigmoidoscopy or colonoscopy. You may have a sigmoidoscopy every 5 years or a colonoscopy every 10 years starting at age 25.  Prostate cancer screening. Recommendations will vary depending  on your family history and other risks.  Hepatitis C blood test.  Hepatitis B blood test.  Sexually transmitted disease (STD)  testing.  Diabetes screening. This is done by checking your blood sugar (glucose) after you have not eaten for a while (fasting). You may have this done every 1-3 years.  Abdominal aortic aneurysm (AAA) screening. You may need this if you are a current or former smoker.  Osteoporosis. You may be screened starting at age 68 if you are at high risk. Talk with your health care provider about your test results, treatment options, and if necessary, the need for more tests. Vaccines  Your health care provider may recommend certain vaccines, such as:  Influenza vaccine. This is recommended every year.  Tetanus, diphtheria, and acellular pertussis (Tdap, Td) vaccine. You may need a Td booster every 10 years.  Zoster vaccine. You may need this after age 13.  Pneumococcal 13-valent conjugate (PCV13) vaccine. One dose is recommended after age 63.  Pneumococcal polysaccharide (PPSV23) vaccine. One dose is recommended after age 93. Talk to your health care provider about which screenings and vaccines you need and how often you need them. This information is not intended to replace advice given to you by your health care provider. Make sure you discuss any questions you have with your health care provider. Document Released: 12/04/2015 Document Revised: 07/27/2016 Document Reviewed: 09/08/2015 Elsevier Interactive Patient Education  2017 [employer].  Fall Prevention in the Home Falls can cause injuries. They can happen to people of all ages. There are many things you can do to make your home safe and to help prevent falls. What can I do on the outside of my home?  Regularly fix the edges of walkways and driveways and fix any cracks.  Remove anything that might make you trip as you walk through a door, such as a raised step or threshold.  Trim any bushes or trees on the path to your home.  Use bright outdoor lighting.  Clear any walking paths of anything that might make someone trip, such as  rocks or tools.  Regularly check to see if handrails are loose or broken. Make sure that both sides of any steps have handrails.  Any raised decks and porches should have guardrails on the edges.  Have any leaves, snow, or ice cleared regularly.  Use sand or salt on walking paths during winter.  Clean up any spills in your garage right away. This includes oil or grease spills. What can I do in the bathroom?  Use night lights.  Install grab bars by the toilet and in the tub and shower. Do not use towel bars as grab bars.  Use non-skid mats or decals in the tub or shower.  If you need to sit down in the shower, use a plastic, non-slip stool.  Keep the floor dry. Clean up any water that spills on the floor as soon as it happens.  Remove soap buildup in the tub or shower regularly.  Attach bath mats securely with double-sided non-slip rug tape.  Do not have throw rugs and other things on the floor that can make you trip. What can I do in the bedroom?  Use night lights.  Make sure that you have a light by your bed that is easy to reach.  Do not use any sheets or blankets that are too big for your bed. They should not hang down onto the floor.  Have a firm chair that has side arms.  You can use this for support while you get dressed.  Do not have throw rugs and other things on the floor that can make you trip. What can I do in the kitchen?  Clean up any spills right away.  Avoid walking on wet floors.  Keep items that you use a lot in easy-to-reach places.  If you need to reach something above you, use a strong step stool that has a grab bar.  Keep electrical cords out of the way.  Do not use floor polish or wax that makes floors slippery. If you must use wax, use non-skid floor wax.  Do not have throw rugs and other things on the floor that can make you trip. What can I do with my stairs?  Do not leave any items on the stairs.  Make sure that there are handrails on  both sides of the stairs and use them. Fix handrails that are broken or loose. Make sure that handrails are as long as the stairways.  Check any carpeting to make sure that it is firmly attached to the stairs. Fix any carpet that is loose or worn.  Avoid having throw rugs at the top or bottom of the stairs. If you do have throw rugs, attach them to the floor with carpet tape.  Make sure that you have a light switch at the top of the stairs and the bottom of the stairs. If you do not have them, ask someone to add them for you. What else can I do to help prevent falls?  Wear shoes that:  Do not have high heels.  Have rubber bottoms.  Are comfortable and fit you well.  Are closed at the toe. Do not wear sandals.  If you use a stepladder:  Make sure that it is fully opened. Do not climb a closed stepladder.  Make sure that both sides of the stepladder are locked into place.  Ask someone to hold it for you, if possible.  Clearly mark and make sure that you can see:  Any grab bars or handrails.  First and last steps.  Where the edge of each step is.  Use tools that help you move around (mobility aids) if they are needed. These include:  Canes.  Walkers.  Scooters.  Crutches.  Turn on the lights when you go into a dark area. Replace any light bulbs as soon as they burn out.  Set up your furniture so you have a clear path. Avoid moving your furniture around.  If any of your floors are uneven, fix them.  If there are any pets around you, be aware of where they are.  Review your medicines with your doctor. Some medicines can make you feel dizzy. This can increase your chance of falling. Ask your doctor what other things that you can do to help prevent falls. This information is not intended to replace advice given to you by your health care provider. Make sure you discuss any questions you have with your health care provider. Document Released: 09/03/2009 Document  Revised: 04/14/2016 Document Reviewed: 12/12/2014 Elsevier Interactive Patient Education  2017 [employer].

## 2020-08-13 NOTE — Progress Notes (Addendum)
Subjective:   Blake Zamora is a 79 y.o. male who presents for Medicare Annual/Subsequent preventive examination.  Review of Systems    No ROS. Medicare Wellness Visit. Cardiac Risk Factors include: advanced age (>91men, >31 women);family history of premature cardiovascular disease;hypertension;male gender;obesity (BMI >30kg/m2)     Objective:    Today's Vitals   08/13/20 0953  BP: (!) 150/80  Pulse: (!) 58  Resp: 16  Temp: 98.2 F (36.8 C)  SpO2: 96%  Weight: 231 lb 9.6 oz (105.1 kg)  Height: 6\' 1"  (1.854 m)  PainSc: 0-No pain   Body mass index is 30.56 kg/m.  Advanced Directives 08/13/2020 08/08/2019 06/28/2018 06/27/2017 01/06/2014  Does Patient Have a Medical Advance Directive? Yes Yes Yes No Patient has advance directive, copy not in chart  Type of Advance Directive Healthcare Power of [place];Living will Healthcare Power of [place];Living will Healthcare Power of [place];Living will - -  Does patient want to make changes to medical advance directive? No - Patient declined - - - -  Copy of Healthcare Power of Attorney in Chart? No - copy requested No - copy requested - - -  Would patient like information on creating a medical advance directive? - - - Yes (ED - Information included in AVS) -    Current Medications (verified) Outpatient Encounter Medications as of 08/13/2020  Medication Sig   amLODipine (NORVASC) 2.5 MG tablet Take 2.5 mg by mouth daily.   Apoaequorin (PREVAGEN PO) Take 1 tablet by mouth.   aspirin 81 MG EC tablet Take 81 mg by mouth daily.     Cholecalciferol (VITAMIN D3) 1000 UNITS tablet Take 1,000 Units by mouth daily.     gabapentin (NEURONTIN) 300 MG capsule Take 300 mg by mouth every other day.   levothyroxine (SYNTHROID, LEVOTHROID) 112 MCG tablet TAKE 1 TABLET BY MOUTH DAILY   metFORMIN (GLUCOPHAGE) 500 MG tablet Take 500 mg by mouth every other day.   ondansetron (ZOFRAN-ODT) 4 MG disintegrating tablet Take 1 tablet (4 mg total) by mouth  every 8 (eight) hours as needed for nausea or vomiting.   Pseudoeph-Doxylamine-DM-APAP (NYQUIL PO) Take 3 mLs by mouth as needed.   tamsulosin (FLOMAX) 0.4 MG CAPS capsule Take 0.4 mg by mouth daily.   vitamin B-12 (CYANOCOBALAMIN) 1000 MCG tablet Take 1,000 mcg by mouth every 3 (three) days.    No facility-administered encounter medications on file as of 08/13/2020.    Allergies (verified) Patient has no known allergies.   History: Past Medical History:  Diagnosis Date   Anxiety    BPH (benign prostatic hypertrophy)    Fluid in knee    Hypothyroidism    Past Surgical History:  Procedure Laterality Date   BACK SURGERY     MITOMYCIN C APPLICATION Right 01/10/2014   Procedure: MITOMYCIN C APPLICATION;  Surgeon: 01/12/2014, MD;  Location: [facility] OR;  Service: Ophthalmology;  Laterality: Right;   PTERYGIUM EXCISION Right 01/10/2014   Procedure: PTERYGIUM EXCISION RIGHT EYE WITH AMNIOTIC GRAFT WITH  TISSUE GLUE MITOMYCIN C;  Surgeon: 01/12/2014, MD;  Location: [facility] OR;  Service: Ophthalmology;  Laterality: Right;   Family History  Problem Relation Age of Onset   Heart disease Mother 19       MI   Cancer Father 17       lung   Thyroid disease Neg Hx    Social History   Socioeconomic History   Marital status: Married    Spouse name: Not on file  Number of children: 0   Years of education: Not on file   Highest education level: Not on file  Occupational History   Occupation: retired  Tobacco Use   Smoking status: Never Smoker   Smokeless tobacco: Never Used  Building services engineerVaping Use   Vaping Use: Never used  Substance and Sexual Activity   Alcohol use: No   Drug use: No   Sexual activity: Yes  Other Topics Concern   Not on file  Social History Narrative   Not on file   Social Determinants of Health   Financial Resource Strain: Low Risk    Difficulty of Paying Living Expenses: Not hard at all  Food Insecurity: No Food Insecurity   Worried About Programme researcher, broadcasting/film/videounning Out of Food in the  Last Year: Never true   Ran Out of Food in the Last Year: Never true  Transportation Needs: No Transportation Needs   Lack of Transportation (Medical): No   Lack of Transportation (Non-Medical): No  Physical Activity: Sufficiently Active   Days of Exercise per Week: 7 days   Minutes of Exercise per Session: 60 min  Stress: No Stress Concern Present   Feeling of Stress : Not at all  Social Connections: Socially Integrated   Frequency of Communication with Friends and Family: More than three times a week   Frequency of Social Gatherings with Friends and Family: More than three times a week   Attends Religious Services: More than 4 times per year   Active Member of [employer] Financial[employer]ubs or Organizations: Yes   Attends Engineer, structuralClub or Organization Meetings: More than 4 times per year   Marital Status: Married    Tobacco Counseling Counseling given: Not Answered   Clinical Intake:  Pre-visit preparation completed: Yes  Pain : No/denies pain Pain Score: 0-No pain     BMI - recorded: 30.56 Nutritional Status: BMI > 30  Obese Nutritional Risks: None Diabetes: No  How often do you need to have someone help you when you read instructions, pamphlets, or other written materials from your doctor or pharmacy?: 1 - Never What is the last grade level you completed in school?: 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 No, pre-diabetic (takes metformin)  Interpreter Needed?: No  Information entered by :: Bretton Tandy N. Ja Ohman, LPN   Activities of Daily Living In your present state of health, do you have any difficulty performing the following activities: 08/13/2020  Hearing? N  Vision? N  Difficulty concentrating or making decisions? N  Walking or climbing stairs? N  Dressing or bathing? N  Doing errands, shopping? N  Preparing Food and eating ? N  Using the Toilet? N  In the past six months, have you accidently leaked urine? N  Do you have problems with loss of bowel control? N   Managing your Medications? N  Managing your Finances? N  Housekeeping or managing your Housekeeping? N  Some recent data might be hidden    Patient Care Team: Plotnikov, Georgina QuintAleksei V, MD as PCP - General  Indicate any recent Medical Services you may have received from other than [facility] providers in the past year (date may be approximate).     Assessment:   This is a routine wellness examination for Blake Zamora.  Hearing/Vision screen No exam data present  Dietary issues and exercise activities discussed: Current Exercise Habits: The patient has a physically strenuous job, but has no regular exercise apart from work., Exercise limited by: None identified  Goals      continue to  be as healthy as possible so I can continue to play golf and fish       Depression Screen PHQ 2/9 Scores 08/13/2020 08/08/2019 06/28/2018 06/27/2017  PHQ - 2 Score 0 1 0 0  PHQ- 9 Score - - - 3    Fall Risk Fall Risk  08/13/2020 08/08/2019 06/28/2018 06/27/2017  Falls in the past year? 0 0 No Yes  Number falls in past yr: 0 0 - 1  Injury with Fall? 0 0 - No  Risk for fall due to : No Fall Risks - - -  Follow up Falls evaluation completed;Education provided - - -    Any stairs in or around the home? Yes  If so, are there any without handrails? No  Home free of loose throw rugs in walkways, pet beds, electrical cords, etc? Yes  Adequate lighting in your home to reduce risk of falls? Yes   ASSISTIVE DEVICES UTILIZED TO PREVENT FALLS:  Life alert? No  Use of a cane, walker or w/c? No  Grab bars in the bathroom? Yes  Shower chair or bench in shower? Yes  Elevated toilet seat or a handicapped toilet? Yes   TIMED UP AND GO:  Was the test performed? No .  Length of time to ambulate 10 feet: 0 sec.   Gait steady and fast without use of assistive device  Cognitive Function: MMSE - Mini Mental State Exam 06/28/2018  Orientation to time 5  Orientation to Place 5  Registration 3  Attention/ Calculation 5  Recall  3  Language- name 2 objects 2  Language- repeat 1  Language- follow 3 step command 3  Language- read & follow direction 1  Write a sentence 1  Copy design 1  Total score 30     6CIT Screen 08/13/2020  What Year? 0 points  What month? 0 points  What time? 0 points  Count back from 20 0 points  Months in reverse 0 points  Repeat phrase 0 points  Total Score 0    Immunizations Immunization History  Administered Date(s) Administered   H1N1 10/30/2008   Influenza Whole 11/30/2009   Influenza, High Dose Seasonal PF 11/25/2016, 09/07/2017   Influenza, Seasonal, Injecte, Preservative Fre 10/16/2012   Influenza,inj,Quad PF,6+ Mos 11/28/2013   Pneumococcal Polysaccharide-23 09/23/2010   Td 11/21/2010   Zoster 11/21/2013    TDAP status: Up to date Flu Vaccine status: Up to date  ([facility]) Pneumococcal vaccine status: Up to date (Received at [facility]) Covid-19 vaccine status: Completed vaccines  Qualifies for Shingles Vaccine? Yes   Zostavax completed Yes   Shingrix Completed?: Yes (Received at [facility])   Screening Tests Health Maintenance  Topic Date Due   Hepatitis C Screening  Never done   COVID-19 Vaccine (1) Never done   PNA vac Low Risk Adult (2 of 2 - PCV13) 09/24/2011   INFLUENZA VACCINE  06/21/2020   TETANUS/TDAP  11/21/2020    Health Maintenance  Health Maintenance Due  Topic Date Due   Hepatitis C Screening  Never done   COVID-19 Vaccine (1) Never done   PNA vac Low Risk Adult (2 of 2 - PCV13) 09/24/2011   INFLUENZA VACCINE  06/21/2020    Colorectal cancer screening: Referral to GI placed yes. Pt aware the office will call re: appt.  (Patient has been scheduled for 08/2020) Lung Cancer Screening: (Low Dose CT Chest recommended if Age 71-80 years, 30 pack-year currently smoking OR have quit w/in 15years.) does  not qualify.   Lung Cancer Screening Referral: no  Additional Screening:  Hepatitis C Screening:  does qualify; Completed no  Vision Screening: Recommended annual ophthalmology exams for early detection of glaucoma and other disorders of the eye. Is the patient up to date with their annual eye exam?  Yes  Who is the provider or what is the name of the office in which the patient attends annual eye exams? Sallye Lat, MD If pt is not established with a provider, would they like to be referred to a provider to establish care? No .   Dental Screening: Recommended annual dental exams for proper oral hygiene  Community Resource Referral / Chronic Care Management: CRR required this visit?  No   CCM required this visit?  No      Plan:     I have personally reviewed and noted the following in the patient's chart:   Medical and social history Use of alcohol, tobacco or illicit drugs  Current medications and supplements Functional ability and status Nutritional status Physical activity Advanced directives List of other physicians Hospitalizations, surgeries, and ER visits in previous 12 months Vitals Screenings to include cognitive, depression, and falls Referrals and appointments  In addition, I have reviewed and discussed with patient certain preventive protocols, quality metrics, and best practice recommendations. A written personalized care plan for preventive services as well as general preventive health recommendations were provided to patient.     [name], LPN   [id number]   Nurse Notes: n/a  Medical screening examination/treatment/procedure(s) were performed by non-physician practitioner and as supervising physician I was immediately available for consultation/collaboration.  I agree with above. Jacinta Shoe, MD

## 2020-08-19 ENCOUNTER — Encounter: Payer: Self-pay | Admitting: Internal Medicine

## 2020-08-19 ENCOUNTER — Other Ambulatory Visit: Payer: Self-pay

## 2020-08-19 ENCOUNTER — Ambulatory Visit (AMBULATORY_SURGERY_CENTER): Payer: Medicare Other | Admitting: *Deleted

## 2020-08-19 VITALS — Ht 73.0 in | Wt 231.0 lb

## 2020-08-19 DIAGNOSIS — Z1211 Encounter for screening for malignant neoplasm of colon: Secondary | ICD-10-CM

## 2020-08-19 MED ORDER — PLENVU 140 G PO SOLR: 1.0000 | Freq: Once | ORAL | 0 refills | Status: AC

## 2020-08-19 NOTE — Progress Notes (Signed)
Patient is here in-person for PV. Patient denies any allergies to eggs or soy. Patient denies any problems with anesthesia/sedation. Patient denies any oxygen use at home. Patient denies taking any diet/weight loss medications or blood thinners. Patient is not being treated for MRSA or C-diff. Patient is aware of our care-partner policy and Covid-19 safety protocol. EMMI education assisgned to the patient for the procedure, sent to MyChart.   COVID-19 vaccines completed on 01/01/20, per patient.   Prep Prescription coupon given to the patient.

## 2020-09-02 ENCOUNTER — Ambulatory Visit: Payer: Medicare Other | Attending: Critical Care Medicine

## 2020-09-02 ENCOUNTER — Ambulatory Visit: Payer: Medicare Other | Admitting: *Deleted

## 2020-09-02 ENCOUNTER — Encounter: Payer: Medicare Other | Admitting: Internal Medicine

## 2020-09-02 DIAGNOSIS — Z23 Encounter for immunization: Secondary | ICD-10-CM

## 2020-09-02 NOTE — Progress Notes (Signed)
Patient tolerated BOOSTER well today Patient was observed with no concerns.

## 2020-09-26 ENCOUNTER — Ambulatory Visit (INDEPENDENT_AMBULATORY_CARE_PROVIDER_SITE_OTHER): Payer: Medicare Other

## 2020-09-26 ENCOUNTER — Ambulatory Visit (HOSPITAL_COMMUNITY)
Admission: EM | Admit: 2020-09-26 | Discharge: 2020-09-26 | Disposition: A | Discharge status: Home / Self Care | Payer: Medicare Other | Attending: Family Medicine | Admitting: Family Medicine

## 2020-09-26 ENCOUNTER — Encounter (HOSPITAL_COMMUNITY): Payer: Self-pay

## 2020-09-26 ENCOUNTER — Other Ambulatory Visit: Payer: Self-pay

## 2020-09-26 DIAGNOSIS — R0789 Other chest pain: Secondary | ICD-10-CM | POA: Diagnosis not present

## 2020-09-26 DIAGNOSIS — R079 Chest pain, unspecified: Secondary | ICD-10-CM | POA: Diagnosis not present

## 2020-09-26 MED ORDER — PREDNISONE 20 MG PO TABS: 40.0000 mg | ORAL_TABLET | Freq: Every day | ORAL | 0 refills | Status: AC

## 2020-09-26 NOTE — Discharge Instructions (Signed)
You have been seen at the [facility] today for chest pain. Your evaluation today was not suggestive of any emergent condition requiring medical intervention at this time. Your chest x-ray and ECG (heart tracing) did not show any worrisome changes. However, some medical problems make take more time to appear. Therefore, it's very important that you pay attention to any new symptoms or worsening of your current condition.  Please proceed directly to the Emergency Department immediately should you feel worse in any way or have any of the following symptoms: increasing or different chest pain, pain that spreads to your arm, neck, jaw, back or abdomen, shortness of breath, or nausea and vomiting.  

## 2020-09-26 NOTE — ED Triage Notes (Signed)
Pt c/o left sternal CP radiating to back for approx 3 dEays. Initially started while pt at rest. Denies recent exertional activities. States pain 2/10 at rest increases to 8/10 with movement or inspiration.  Denies SOB, cough, n/v, diaphoresis, dizziness, radiating to jaw/shouldera/arms, extremity weakness, slurred speech, changes in vision. Denies recent illness or URI sx.  Took CBG at home Monday and reports it as 134.  Pt AO x4, appears in pain with movement.  EKG performed and given to Dr. Tracie Harrier, no new orders received. Bilateral lungs CTA, no pedal edema noted.

## 2020-09-28 NOTE — ED Provider Notes (Signed)
[facility]   [id number] 09/26/20 Arrival Time: 1334  ASSESSMENT & PLAN:  1. Chest pain, unspecified type     ECG: Performed today and interpreted by me:  Sinus rhythm with Premature supraventricular complexes Nonspecific T wave abnormality Abnormal ECG No significant change since last tracing.  I have personally viewed the imaging studies ordered this visit. No acute changes on CXR.  From his description of symptoms this sounds like MSK pain. Discussed.  Begin trial of: Meds ordered this encounter  Medications  . predniSONE (DELTASONE) 20 MG tablet    Sig: Take 2 tablets (40 mg total) by mouth daily.    Dispense:  10 tablet    Refill:  0    Chest pain precautions given. Reviewed expectations re: course of current medical issues. Questions answered. Outlined signs and symptoms indicating need for more acute intervention. Patient verbalized understanding. After Visit Summary given.   SUBJECTIVE:  History from: patient. Blake Zamora is a 79 y.o. male who presents with complaint of intermittent L sternal CP; sharp; radiates to R shoulder and to back sometimes. Almost always triggered by certain movements or deep breaths. No associated SOB, n/v. No recent illnesses. Afebrile. No injury/trauma. Does not wake him from sleep. No LE edema. Ambulatory without difficulty. Illicit drug use: none.  Social History   Tobacco Use  Smoking Status Never Smoker  Smokeless Tobacco Never Used   Social History   Substance and Sexual Activity  Alcohol Use Not Currently    OBJECTIVE:  Vitals:   09/26/20 1352  BP: (!) 163/80  Pulse: 68  Resp: 20  Temp: 99.6 F (37.6 C)  TempSrc: Oral  SpO2: 96%    General appearance: alert, oriented, no acute distress Eyes: PERRLA; EOMI; conjunctivae normal HENT: normocephalic; atraumatic Neck: supple with FROM Lungs: without labored respirations; speaks full sentences without difficulty; CTAB Heart: regular rate and  rhythm Chest Wall: mild tenderness to palpation around L sternal area of chest Abdomen: soft, non-tender; no guarding or rebound tenderness Extremities: without edema; without calf swelling or tenderness; symmetrical without gross deformities Skin: warm and dry; without rash or lesions Neuro: normal gait Psychological: alert and cooperative; normal mood and affect   No Known Allergies  Past Medical History:  Diagnosis Date  . Anxiety   . BPH (benign prostatic hypertrophy)   . Diabetes mellitus without complication (HCC)    prediabetic per pt  . Fluid in knee   . Hypertension   . Hypothyroidism    Social History   Socioeconomic History  . Marital status: Married    Spouse name: Not on file  . Number of children: 0  . Years of education: Not on file  . Highest education level: Not on file  Occupational History  . Occupation: retired  Tobacco Use  . Smoking status: Never Smoker  . Smokeless tobacco: Never Used  Vaping Use  . Vaping Use: Never used  Substance and Sexual Activity  . Alcohol use: Not Currently  . Drug use: No  . Sexual activity: Yes  Other Topics Concern  . Not on file  Social History Narrative  . Not on file   Social Determinants of Health   Financial Resource Strain: Low Risk   . Difficulty of Paying Living Expenses: Not hard at all  Food Insecurity: No Food Insecurity  . Worried About Programme researcher, broadcasting/film/video in the Last Year: Never true  . Ran Out of Food in the Last Year: Never true  Transportation Needs: No  Transportation Needs  . Lack of Transportation (Medical): No  . Lack of Transportation (Non-Medical): No  Physical Activity: Sufficiently Active  . Days of Exercise per Week: 7 days  . Minutes of Exercise per Session: 60 min  Stress: No Stress Concern Present  . Feeling of Stress : Not at all  Social Connections: Socially Integrated  . Frequency of Communication with Friends and Family: More than three times a week  . Frequency of Social  Gatherings with Friends and Family: More than three times a week  . Attends Religious Services: More than 4 times per year  . Active Member of Clubs or Organizations: Yes  . Attends Banker Meetings: More than 4 times per year  . Marital Status: Married  Catering manager Violence: Not At Risk  . Fear of Current or Ex-Partner: No  . Emotionally Abused: No  . Physically Abused: No  . Sexually Abused: No   Family History  Problem Relation Age of Onset  . Heart disease Mother 40       MI  . Cancer Father 28       lung  . Thyroid disease Neg Hx   . Colon polyps Neg Hx   . Colon cancer Neg Hx   . Esophageal cancer Neg Hx   . Rectal cancer Neg Hx   . Stomach cancer Neg Hx    Past Surgical History:  Procedure Laterality Date  . BACK SURGERY    . COLONOSCOPY  09/22/2009  . MITOMYCIN C APPLICATION Right 01/10/2014   Procedure: MITOMYCIN C APPLICATION;  Surgeon: Trish Fountain, MD;  Location: [facility] OR;  Service: Ophthalmology;  Laterality: Right;  . PTERYGIUM EXCISION Right 01/10/2014   Procedure: PTERYGIUM EXCISION RIGHT EYE WITH AMNIOTIC GRAFT WITH  TISSUE GLUE MITOMYCIN C;  Surgeon: Trish Fountain, MD;  Location: [facility];  Service: Ophthalmology;  Laterality: Right;     Mardella Layman, MD 09/28/20 [phone]

## 2020-10-27 ENCOUNTER — Encounter: Payer: Medicare Other | Admitting: Internal Medicine

## 2021-04-02 ENCOUNTER — Telehealth: Payer: Self-pay | Admitting: Internal Medicine

## 2021-04-02 NOTE — Progress Notes (Signed)
  Chronic Care Management   Outreach Note  04/02/2021 Name: Blake Zamora MRN: [id number] DOB: September 10, 1941  Referred by: Tresa Garter, MD Reason for referral : No chief complaint on file.   An unsuccessful telephone outreach was attempted today. The patient was referred to the pharmacist for assistance with care management and care coordination.   Follow Up Plan:   Alvie Heidelberg Upstream Scheduler

## 2021-05-18 IMAGING — DX DG CHEST 2V
2 series · 2 of 2 positions shown · non-contrast
Comparison: January 06, 2014

CLINICAL DATA: Chronic cough

EXAM:
CHEST - 2 VIEW

[chest pa]
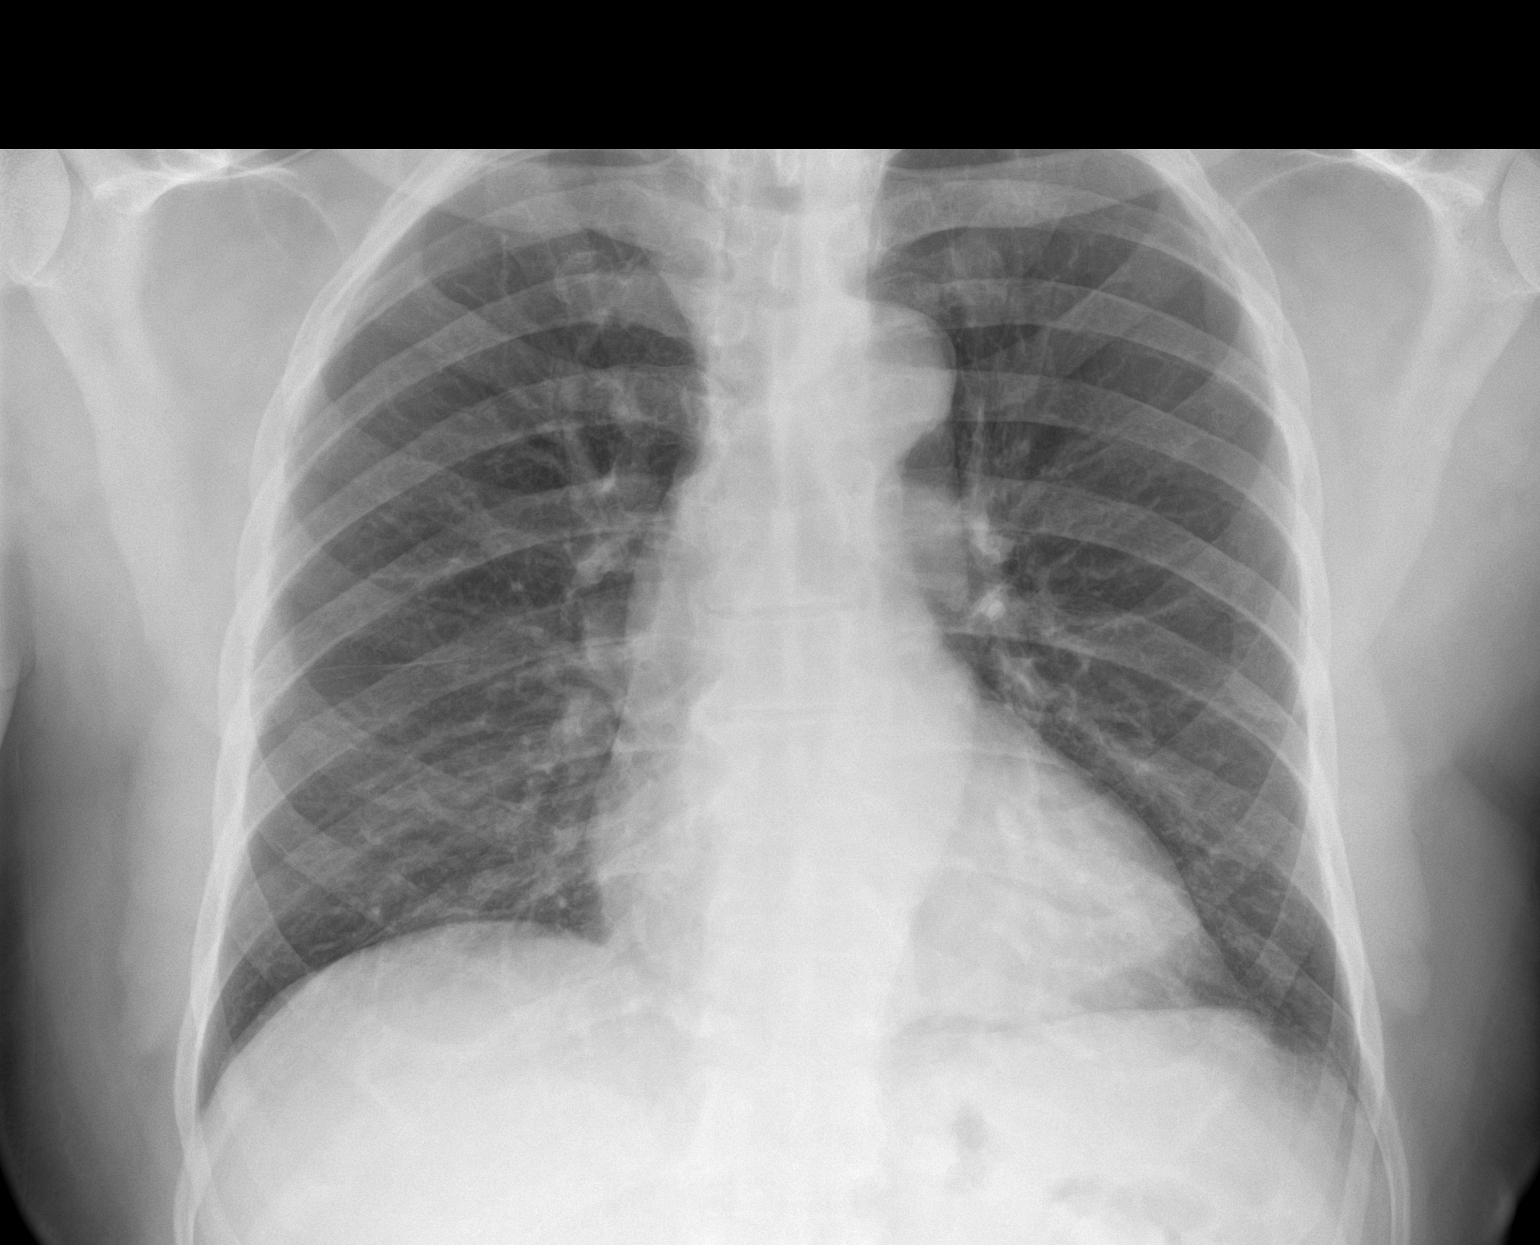

[chest lat]
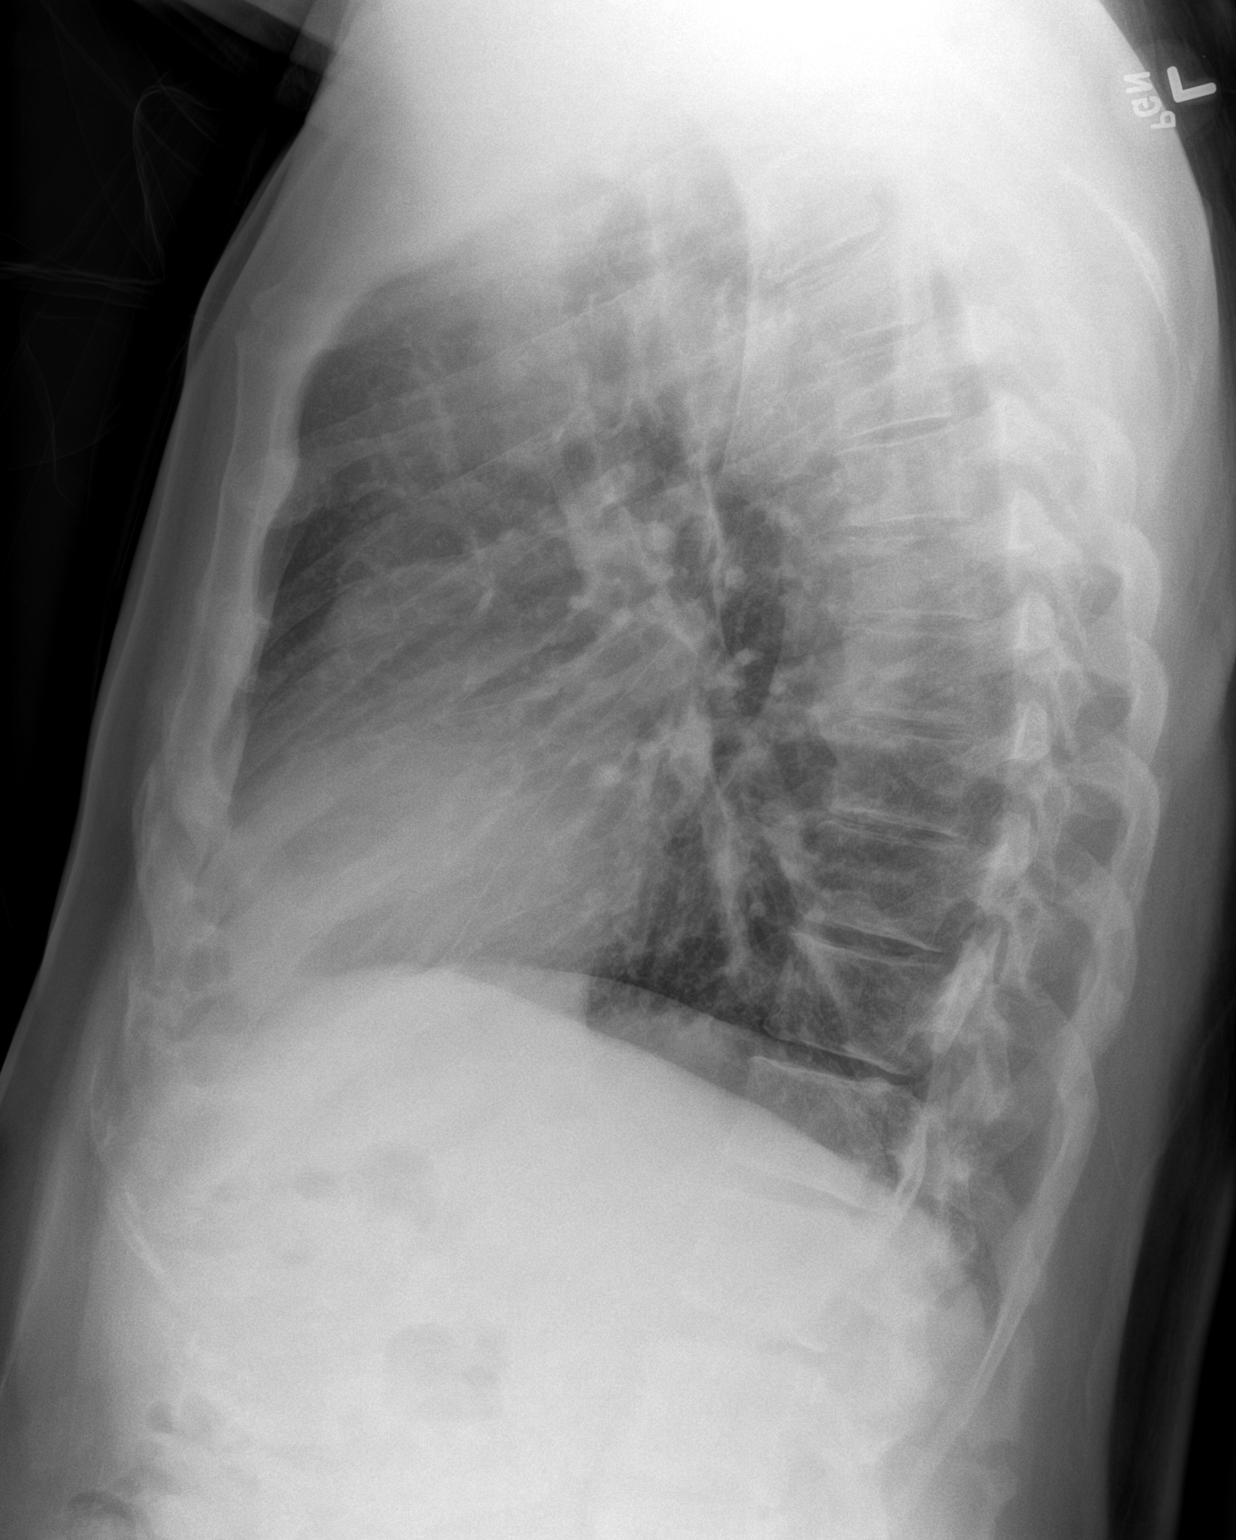

[2 of 2 positions shown; findings below may reference images not displayed]

FINDINGS: The cardiomediastinal silhouette is unchanged in contour.Tortuous
thoracic aorta. No pleural effusion. No pneumothorax. No acute
pleuroparenchymal abnormality. Visualized abdomen is unremarkable.
Mild degenerative changes of the thoracic spine.
IMPRESSION: No acute cardiopulmonary abnormality.

## 2021-05-26 DIAGNOSIS — Z23 Encounter for immunization: Secondary | ICD-10-CM | POA: Diagnosis not present

## 2021-05-28 DIAGNOSIS — H40013 Open angle with borderline findings, low risk, bilateral: Secondary | ICD-10-CM | POA: Diagnosis not present

## 2021-05-28 DIAGNOSIS — H11421 Conjunctival edema, right eye: Secondary | ICD-10-CM | POA: Diagnosis not present

## 2021-05-28 DIAGNOSIS — H0102A Squamous blepharitis right eye, upper and lower eyelids: Secondary | ICD-10-CM | POA: Diagnosis not present

## 2021-05-28 DIAGNOSIS — H04123 Dry eye syndrome of bilateral lacrimal glands: Secondary | ICD-10-CM | POA: Diagnosis not present

## 2021-05-28 DIAGNOSIS — H0102B Squamous blepharitis left eye, upper and lower eyelids: Secondary | ICD-10-CM | POA: Diagnosis not present

## 2021-05-28 DIAGNOSIS — H2513 Age-related nuclear cataract, bilateral: Secondary | ICD-10-CM | POA: Diagnosis not present

## 2021-08-23 DIAGNOSIS — Z79899 Other long term (current) drug therapy: Secondary | ICD-10-CM | POA: Diagnosis not present

## 2021-08-23 DIAGNOSIS — M79605 Pain in left leg: Secondary | ICD-10-CM | POA: Diagnosis not present

## 2021-08-23 DIAGNOSIS — M7989 Other specified soft tissue disorders: Secondary | ICD-10-CM | POA: Diagnosis not present

## 2021-08-23 DIAGNOSIS — R21 Rash and other nonspecific skin eruption: Secondary | ICD-10-CM | POA: Diagnosis not present

## 2021-08-23 DIAGNOSIS — R2242 Localized swelling, mass and lump, left lower limb: Secondary | ICD-10-CM | POA: Diagnosis not present

## 2021-08-23 DIAGNOSIS — R609 Edema, unspecified: Secondary | ICD-10-CM | POA: Diagnosis not present

## 2021-09-13 ENCOUNTER — Other Ambulatory Visit: Payer: Self-pay

## 2021-09-13 ENCOUNTER — Ambulatory Visit (INDEPENDENT_AMBULATORY_CARE_PROVIDER_SITE_OTHER): Payer: Medicare Other | Admitting: *Deleted

## 2021-09-13 DIAGNOSIS — Z23 Encounter for immunization: Secondary | ICD-10-CM | POA: Diagnosis not present

## 2021-09-21 ENCOUNTER — Ambulatory Visit (INDEPENDENT_AMBULATORY_CARE_PROVIDER_SITE_OTHER): Payer: Medicare Other | Admitting: Internal Medicine

## 2021-09-21 ENCOUNTER — Other Ambulatory Visit: Payer: Self-pay

## 2021-09-21 ENCOUNTER — Encounter: Payer: Self-pay | Admitting: Internal Medicine

## 2021-09-21 DIAGNOSIS — E063 Autoimmune thyroiditis: Secondary | ICD-10-CM

## 2021-09-21 DIAGNOSIS — E038 Other specified hypothyroidism: Secondary | ICD-10-CM

## 2021-09-21 DIAGNOSIS — R21 Rash and other nonspecific skin eruption: Secondary | ICD-10-CM

## 2021-09-21 DIAGNOSIS — R609 Edema, unspecified: Secondary | ICD-10-CM

## 2021-09-21 MED ORDER — FUROSEMIDE 20 MG PO TABS: 20.0000 mg | ORAL_TABLET | Freq: Every day | ORAL | 3 refills | Status: AC | PRN

## 2021-09-21 MED ORDER — TRIAMCINOLONE ACETONIDE 0.1 % EX CREA: 1.0000 "application " | TOPICAL_CREAM | Freq: Three times a day (TID) | CUTANEOUS | 3 refills | Status: DC | PRN

## 2021-09-21 MED ORDER — FUROSEMIDE 20 MG PO TABS: 20.0000 mg | ORAL_TABLET | Freq: Every day | ORAL | 3 refills | Status: DC | PRN

## 2021-09-21 MED ORDER — TRIAMCINOLONE ACETONIDE 0.1 % EX CREA: 1.0000 "application " | TOPICAL_CREAM | Freq: Three times a day (TID) | CUTANEOUS | 3 refills | Status: AC | PRN

## 2021-09-21 NOTE — Progress Notes (Signed)
Subjective:  Patient ID: Blake Zamora, male    DOB: [birth year]  Age: 80 y.o. MRN: [id number]  CC: Leg Swelling ((L) leg, also have a rash on it)   HPI Blake Zamora presents for LLE swelling and rash. S/p ER visit on 10/3: no DVT, nl BNP. Better now. He took Keflex. ER notes, tests reviewed  Pt had labs (TSH, thyroid US, PSA at the [place])   Outpatient Medications Prior to Visit  Medication Sig Dispense Refill   amLODipine (NORVASC) 2.5 MG tablet Take 2.5 mg by mouth daily.     aspirin 81 MG EC tablet Take 81 mg by mouth daily.       Cholecalciferol (VITAMIN D3) 1000 UNITS tablet Take 1,000 Units by mouth daily.       gabapentin (NEURONTIN) 300 MG capsule Take 300 mg by mouth every other day.     levothyroxine (SYNTHROID, LEVOTHROID) 112 MCG tablet TAKE 1 TABLET BY MOUTH DAILY 90 tablet 3   metFORMIN (GLUCOPHAGE) 500 MG tablet Take 500 mg by mouth every other day.     predniSONE (DELTASONE) 20 MG tablet Take 2 tablets (40 mg total) by mouth daily. 10 tablet 0   tamsulosin (FLOMAX) 0.4 MG CAPS capsule Take 0.4 mg by mouth daily.     vitamin B-12 (CYANOCOBALAMIN) 1000 MCG tablet Take 1,000 mcg by mouth every 3 (three) days.      No facility-administered medications prior to visit.    ROS: Review of Systems  Constitutional:  Negative for appetite change, fatigue and unexpected weight change.  HENT:  Negative for congestion, nosebleeds, sneezing, sore throat and trouble swallowing.   Eyes:  Negative for itching and visual disturbance.  Respiratory:  Negative for cough.   Cardiovascular:  Positive for leg swelling. Negative for chest pain and palpitations.  Gastrointestinal:  Negative for abdominal distention, blood in stool, diarrhea and nausea.  Genitourinary:  Negative for frequency and hematuria.  Musculoskeletal:  Negative for back pain, gait problem, joint swelling and neck pain.  Skin:  Negative for rash.  Neurological:  Negative for dizziness, tremors, speech difficulty and  weakness.  Psychiatric/Behavioral:  Negative for agitation, dysphoric mood and sleep disturbance. The patient is not nervous/anxious.    Objective:  BP 130/72 (BP Location: Left Arm)   Pulse (!) 55   Temp 98.7 F (37.1 C) (Oral)   Ht 6\' 1"  (1.854 m)   Wt 233 lb 6.4 oz (105.9 kg)   SpO2 96%   BMI 30.79 kg/m   BP Readings from Last 3 Encounters:  09/21/21 130/72  09/26/20 (!) 163/80  08/13/20 (!) 150/80    Wt Readings from Last 3 Encounters:  09/21/21 233 lb 6.4 oz (105.9 kg)  08/19/20 231 lb (104.8 kg)  08/13/20 231 lb 9.6 oz (105.1 kg)    Physical Exam Constitutional:      General: He is not in acute distress.    Appearance: He is well-developed.     Comments: NAD  Eyes:     Conjunctiva/sclera: Conjunctivae normal.     Pupils: Pupils are equal, round, and reactive to light.  Neck:     Thyroid: No thyromegaly.     Vascular: No JVD.  Cardiovascular:     Rate and Rhythm: Normal rate and regular rhythm.     Heart sounds: Normal heart sounds. No murmur heard.   No friction rub. No gallop.  Pulmonary:     Effort: Pulmonary effort is normal. No respiratory distress.     Breath  sounds: Normal breath sounds. No wheezing or rales.  Chest:     Chest wall: No tenderness.  Abdominal:     General: Bowel sounds are normal. There is no distension.     Palpations: Abdomen is soft. There is no mass.     Tenderness: There is no abdominal tenderness. There is no guarding or rebound.  Musculoskeletal:        General: Swelling present. No tenderness. Normal range of motion.     Cervical back: Normal range of motion.     Right lower leg: No edema.     Left lower leg: Edema present.  Lymphadenopathy:     Cervical: No cervical adenopathy.  Skin:    General: Skin is warm and dry.     Findings: Rash present.  Neurological:     Mental Status: He is alert and oriented to person, place, and time.     Cranial Nerves: No cranial nerve deficit.     Motor: No abnormal muscle tone.      Coordination: Coordination normal.     Gait: Gait normal.     Deep Tendon Reflexes: Reflexes are normal and symmetric.  Psychiatric:        Behavior: Behavior normal.        Thought Content: Thought content normal.        Judgment: Judgment normal.  L ankle and shin 1+ edema NT, faint rash  Lab Results  Component Value Date   WBC 3.8 (L) 06/09/2020   HGB 12.1 (L) 06/09/2020   HCT 35.7 (L) 06/09/2020   PLT 164.0 06/09/2020   GLUCOSE 103 (H) 06/09/2020   CHOL 112 06/09/2020   TRIG 105.0 06/09/2020   HDL 29.10 (L) 06/09/2020   LDLCALC 62 06/09/2020   ALT 15 06/09/2020   AST 18 06/09/2020   NA 138 06/09/2020   K 3.9 06/09/2020   CL 106 06/09/2020   CREATININE 0.84 06/09/2020   BUN 15 06/09/2020   CO2 27 06/09/2020   TSH 0.86 06/09/2020   PSA 0.93 06/09/2020   HGBA1C 6.4 07/12/2017    DG Chest 2 View  Result Date: 09/26/2020 CLINICAL DATA:  Left-sided chest pain for 2 days. EXAM: CHEST - 2 VIEW COMPARISON:  06/15/2020 FINDINGS: Heart is normal in size. Stable mediastinal contours with mild aortic tortuosity. The lungs are clear. Pulmonary vasculature is normal. No consolidation, pleural effusion, or pneumothorax. No acute osseous abnormalities are seen. IMPRESSION: No acute chest findings. Electronically Signed   By: Narda Rutherford M.D.   On: 09/26/2020 14:59    Assessment & Plan:   Problem List Items Addressed This Visit     Edema    LLE swelling and rash. S/p ER visit on 10/3: no DVT, nl BNP. Better now. He took Keflex Likely venous stasis dermatitis: treat w/compression sock Triamc cream Furosemide prn Pt had labs (TSH, thyroid US, PSA at the [place])      Hypothyroidism    Will monitor TSH. Pt had labs (TSH, thyroid US, PSA at the [place])      Rash    New. Venous stasis dermatitis LLE subacute - LLE swelling and rash. S/p ER visit on 10/3: no DVT, nl BNP. Better now. He took Keflex Likely venous stasis dermatitis: treat w/compression sock Triamc cream Furosemide  prn         Meds ordered this encounter  Medications   DISCONTD: triamcinolone cream (KENALOG) 0.1 %    Sig: Apply 1 application topically 3 (three) times daily as needed.  Rash    Dispense:  80 g    Refill:  3   DISCONTD: furosemide (LASIX) 20 MG tablet    Sig: Take 1 tablet (20 mg total) by mouth daily as needed.    Dispense:  30 tablet    Refill:  3   triamcinolone cream (KENALOG) 0.1 %    Sig: Apply 1 application topically 3 (three) times daily as needed. Rash    Dispense:  80 g    Refill:  3   furosemide (LASIX) 20 MG tablet    Sig: Take 1 tablet (20 mg total) by mouth daily as needed.    Dispense:  30 tablet    Refill:  3      Follow-up: Return in about 3 months (around 12/22/2021) for a follow-up visit.  Walker Kehr, MD

## 2021-09-21 NOTE — Patient Instructions (Addendum)
Stasis Dermatitis Stasis dermatitis is a long-term (chronic) skin condition that happens when veins can no longer pump blood back to the heart (poor circulation). This condition causes a red or brown scaly rash or sores (ulcers) from the pooling of blood (stasis). This condition usually affects the lower legs. It may affect one leg or both legs. Without treatment, severe stasis dermatitis can lead to other skin conditions and infections. What are the causes? This condition is caused by poor circulation. What increases the risk? You are more likely to develop this condition if: You are not very active. You stand for long periods of time. You have veins that have become enlarged and twisted (varicose veins). You have leg veins that are not strong enough to send blood back to the heart (venous insufficiency). You have had a blood clot. You have been pregnant many times. You have had vein surgery. You are obese. You have heart or kidney failure. You are 50 years of age or older. You have had injuries to your legs in the past. What are the signs or symptoms? Common early symptoms of this condition include: Itchiness in one or both of your legs. Swelling in your ankle or leg. This might get better overnight but be worse again during the day. Skin that looks thin on your ankle and leg. Red or brown marks that develop slowly. Skin that is dry, cracked, or easily irritated. Red, swollen skin that is sore or has a burning feeling. An achy or heavy feeling after you walk or stand for long periods of time. Pain. Later and more severe symptoms of this condition include: Skin that looks shiny. Small, open sores (ulcers). These are often red or purple and leak fluid. Skin that feels hard. Severe itching. A change in the shape or color of your lower legs. Severe pain. Difficulty walking. How is this diagnosed? This condition may be diagnosed based on: Your symptoms and medical history. A  physical exam. You may also have tests, including: Blood tests. Imaging tests to check blood flow (Doppler ultrasound). Allergy tests. You may need to see a health care provider who specializes in skin diseases (dermatologist). How is this treated? This condition may be treated with: Compression stockings or an elastic wrap to improve circulation. Medicines, such as: Corticosteroid creams and ointments. Non-corticosteroid medicines applied to the skin (topical). Medicine to reduce swelling in the legs (diuretics). Antibiotics. Medicine to relieve itching (antihistamines). A bandage (dressing). A wrap that contains zinc and gelatin (Unna boot). Follow these instructions at home: Skin care Moisturize your skin as told by your health care provider. Do not use moisturizers with fragrance. This can irritate your skin. Apply a cool, wet cloth (cool compress) to the affected areas. Do not scratch your skin. Do not rub your skin dry after a bath or shower. Gently pat your skin dry. Do not use scented soaps, detergents, or perfumes. Medicines Take or use over-the-counter and prescription medicines only as told by your health care provider. If you were prescribed an antibiotic medicine, take or use it as told by your health care provider. Do not stop taking or using the antibiotic even if your condition improves. Activity Walk as told by your health care provider. Walking increases blood flow. Do calf and ankle exercises throughout the day as told by your health care provider. This will help increase blood flow. Raise (elevate) your legs above the level of your heart when you are sitting or lying down. Lifestyle Work with your health   care provider to lose weight, if needed. Do not cross your legs when you sit. Do not stand or sit in one position for long periods of time. Wear comfortable, loose-fitting clothing. Circulation in your legs will be worse if you wear tight pants, belts, and  waistbands. Do not use any products that contain nicotine or tobacco, such as cigarettes, e-cigarettes, and chewing tobacco. If you need help quitting, ask your health care provider. General instructions If you were asked to use one of the following to help with your condition, follow instructions from your health care provider on how to: Remove and change any dressing. Wear compression stockings. These stockings help to prevent blood clots and reduce swelling in your legs. Wear the Unna boot. Keep all follow-up visits as told by your health care provider. This is important. Contact a health care provider if: Your condition does not improve with treatment. Your condition gets worse. You have signs of infection in the affected area. Watch for: Swelling. Tenderness. Redness. Soreness. Warmth. You have a fever. Get help right away if: You notice red streaks coming from the affected area. Your bone or joint underneath the affected area becomes painful after the skin has healed. The affected area turns darker. You feel a deep pain in your leg or groin. You are short of breath. Summary Stasis dermatitis is a long-term (chronic) skin condition that happens when veins can no longer pump blood back to the heart (poor circulation). Wear compression stockings as told by your health care provider. These stockings help to prevent blood clots and reduce swelling in your legs. Follow instructions from your health care provider about activity, medicines, and lifestyle. Contact a health care provider if you have a fever or have signs of infection in the affected area. Keep all follow-up visits as told by your health care provider. This is important. This information is not intended to replace advice given to you by your health care provider. Make sure you discuss any questions you have with your health care provider. Document Revised: 01/18/2021 Document Reviewed: 01/18/2021 Elsevier Patient Education   2022 [employer]  

## 2021-09-21 NOTE — Assessment & Plan Note (Addendum)
LLE swelling and rash. S/p ER visit on 10/3: no DVT, nl BNP. Better now. He took Keflex Likely venous stasis dermatitis: treat w/compression sock Triamc cream Furosemide prn Pt had labs (TSH, thyroid US, PSA at the [place])

## 2021-09-21 NOTE — Assessment & Plan Note (Signed)
Will monitor TSH. Pt had labs (TSH, thyroid US, PSA at the [place])

## 2021-09-21 NOTE — Assessment & Plan Note (Signed)
New. Venous stasis dermatitis LLE subacute - LLE swelling and rash. S/p ER visit on 10/3: no DVT, nl BNP. Better now. He took Keflex Likely venous stasis dermatitis: treat w/compression sock Triamc cream Furosemide prn

## 2021-09-27 DIAGNOSIS — Z23 Encounter for immunization: Secondary | ICD-10-CM | POA: Diagnosis not present

## 2021-10-08 ENCOUNTER — Ambulatory Visit: Payer: Medicare Other

## 2021-10-08 ENCOUNTER — Ambulatory Visit (INDEPENDENT_AMBULATORY_CARE_PROVIDER_SITE_OTHER): Payer: Medicare Other

## 2021-10-08 DIAGNOSIS — Z0000 Encounter for general adult medical examination without abnormal findings: Secondary | ICD-10-CM | POA: Diagnosis not present

## 2021-10-08 NOTE — Progress Notes (Addendum)
Subjective:   Blake Zamora is a 80 y.o. male who presents for an Subsequent Medicare Annual Wellness Visit.   I connected with Blake Zamora today by telephone and verified that I am speaking with the correct person using two identifiers. Location patient: home Location provider: work Persons participating in the virtual visit: patient, provider.   I discussed the limitations, risks, security and privacy concerns of performing an evaluation and management service by telephone and the availability of in person appointments. I also discussed with the patient that there may be a patient responsible charge related to this service. The patient expressed understanding and verbally consented to this telephonic visit.    Interactive audio and video telecommunications were attempted between this provider and patient, however failed, due to patient having technical difficulties OR patient did not have access to video capability.  We continued and completed visit with audio only.    Review of Systems     Cardiac Risk Factors include: advanced age (>72men, >93 women);diabetes mellitus;dyslipidemia;hypertension;male gender     Objective:    Today's Vitals   There is no height or weight on file to calculate BMI.  Advanced Directives 10/08/2021 08/13/2020 08/08/2019 06/28/2018 06/27/2017 01/06/2014  Does Patient Have a Medical Advance Directive? Yes Yes Yes Yes No Patient has advance directive, copy not in chart  Type of Advance Directive Healthcare Power of [place];Living will Healthcare Power of [place];Living will Healthcare Power of [place];Living will Healthcare Power of [place];Living will - -  Does patient want to make changes to medical advance directive? - No - Patient declined - - - -  Copy of Healthcare Power of Attorney in Chart? No - copy requested No - copy requested No - copy requested - - -  Would patient like information on creating a medical advance directive? - - - - Yes (ED -  Information included in AVS) -    Current Medications (verified) Outpatient Encounter Medications as of 10/08/2021  Medication Sig   amLODipine (NORVASC) 2.5 MG tablet Take 2.5 mg by mouth daily.   aspirin 81 MG EC tablet Take 81 mg by mouth daily.     Cholecalciferol (VITAMIN D3) 1000 UNITS tablet Take 1,000 Units by mouth daily.     gabapentin (NEURONTIN) 300 MG capsule Take 300 mg by mouth every other day.   levothyroxine (SYNTHROID, LEVOTHROID) 112 MCG tablet TAKE 1 TABLET BY MOUTH DAILY   metFORMIN (GLUCOPHAGE) 500 MG tablet Take 500 mg by mouth every other day.   tamsulosin (FLOMAX) 0.4 MG CAPS capsule Take 0.4 mg by mouth daily.   triamcinolone cream (KENALOG) 0.1 % Apply 1 application topically 3 (three) times daily as needed. Rash   vitamin B-12 (CYANOCOBALAMIN) 1000 MCG tablet Take 1,000 mcg by mouth every 3 (three) days.    furosemide (LASIX) 20 MG tablet Take 1 tablet (20 mg total) by mouth daily as needed. (Patient not taking: Reported on 10/08/2021)   predniSONE (DELTASONE) 20 MG tablet Take 2 tablets (40 mg total) by mouth daily. (Patient not taking: Reported on 10/08/2021)   No facility-administered encounter medications on file as of 10/08/2021.    Allergies (verified) Patient has no known allergies.   History: Past Medical History:  Diagnosis Date   Anxiety    BPH (benign prostatic hypertrophy)    Diabetes mellitus without complication (HCC)    prediabetic per pt   Fluid in knee    Hypertension    Hypothyroidism    Past Surgical History:  Procedure Laterality Date  BACK SURGERY     COLONOSCOPY  09/22/2009   MITOMYCIN C APPLICATION Right 01/10/2014   Procedure: MITOMYCIN C APPLICATION;  Surgeon: Trish Fountain, MD;  Location: [facility] OR;  Service: Ophthalmology;  Laterality: Right;   PTERYGIUM EXCISION Right 01/10/2014   Procedure: PTERYGIUM EXCISION RIGHT EYE WITH AMNIOTIC GRAFT WITH  TISSUE GLUE MITOMYCIN C;  Surgeon: Trish Fountain, MD;  Location: [facility] OR;   Service: Ophthalmology;  Laterality: Right;   Family History  Problem Relation Age of Onset   Heart disease Mother 53       MI   Cancer Father 82       lung   Thyroid disease Neg Hx    Colon polyps Neg Hx    Colon cancer Neg Hx    Esophageal cancer Neg Hx    Rectal cancer Neg Hx    Stomach cancer Neg Hx    Social History   Socioeconomic History   Marital status: Married    Spouse name: Not on file   Number of children: 0   Years of education: Not on file   Highest education level: Not on file  Occupational History   Occupation: retired  Tobacco Use   Smoking status: Never   Smokeless tobacco: Never  Vaping Use   Vaping Use: Never used  Substance and Sexual Activity   Alcohol use: Not Currently   Drug use: No   Sexual activity: Yes  Other Topics Concern   Not on file  Social History Narrative   Not on file   Social Determinants of Health   Financial Resource Strain: Low Risk    Difficulty of Paying Living Expenses: Not hard at all  Food Insecurity: No Food Insecurity   Worried About Programme researcher, broadcasting/film/video in the Last Year: Never true   Ran Out of Food in the Last Year: Never true  Transportation Needs: No Transportation Needs   Lack of Transportation (Medical): No   Lack of Transportation (Non-Medical): No  Physical Activity: Insufficiently Active   Days of Exercise per Week: 3 days   Minutes of Exercise per Session: 30 min  Stress: No Stress Concern Present   Feeling of Stress : Not at all  Social Connections: Moderately Isolated   Frequency of Communication with Friends and Family: Twice a week   Frequency of Social Gatherings with Friends and Family: Twice a week   Attends Religious Services: Never   Diplomatic Services operational officer: No   Attends Engineer, structural: Never   Marital Status: Married    Tobacco Counseling Counseling given: Not Answered   Clinical Intake:  Pre-visit preparation completed: Yes  Pain : No/denies  pain     Nutritional Risks: None Diabetes: Yes CBG done?: No Did pt. bring in CBG monitor from home?: No  How often do you need to have someone help you when you read instructions, pamphlets, or other written materials from your doctor or pharmacy?: 1 - Never What is the last grade level you completed in school?: college  Diabetic?yes Nutrition Risk Assessment:  Has the patient had any N/V/D within the last 2 months?  No  Does the patient have any non-healing wounds?  No  Has the patient had any unintentional weight loss or weight gain?  No   Diabetes:  Is the patient diabetic?  Yes  If diabetic, was a CBG obtained today?  No  Did the patient bring in their glucometer from home?  No  How often  do you monitor your CBG's? 4 x week .   Financial Strains and Diabetes Management:  Are you having any financial strains with the device, your supplies or your medication? No .  Does the patient want to be seen by Chronic Care Management for management of their diabetes?  No  Would the patient like to be referred to a Nutritionist or for Diabetic Management?  No   Diabetic Exams:  Diabetic Eye Exam: Completed 04/2021 Diabetic Foot Exam: Overdue, Pt has been advised about the importance in completing this exam. Pt is scheduled for diabetic foot exam on next office visit .   Interpreter Needed?: No  Information entered by :: L.Heavyn Yearsley,LPN   Activities of Daily Living In your present state of health, do you have any difficulty performing the following activities: 10/08/2021  Hearing? N  Vision? N  Difficulty concentrating or making decisions? N  Walking or climbing stairs? N  Dressing or bathing? N  Doing errands, shopping? N  Preparing Food and eating ? N  Using the Toilet? N  In the past six months, have you accidently leaked urine? N  Do you have problems with loss of bowel control? N  Managing your Medications? N  Managing your Finances? N  Housekeeping or managing your  Housekeeping? N  Some recent data might be hidden    Patient Care Team: Plotnikov, Georgina Quint, MD as PCP - General  Indicate any recent Medical Services you may have received from other than [facility] providers in the past year (date may be approximate).     Assessment:   This is a routine wellness examination for Kacy.  Hearing/Vision screen Vision Screening - Comments:: Annual eye exams   Dietary issues and exercise activities discussed: Current Exercise Habits: Home exercise routine, Type of exercise: walking, Time (Minutes): 30, Frequency (Times/Week): 3, Weekly Exercise (Minutes/Week): 90, Intensity: Mild, Exercise limited by: None identified   Goals Addressed             This Visit's Progress    continue to be as healthy as possible so I can continue to play golf and fish   On track      Depression Screen PHQ 2/9 Scores 10/08/2021 10/08/2021 08/13/2020 08/08/2019 06/28/2018 06/27/2017  PHQ - 2 Score 0 0 0 1 0 0  PHQ- 9 Score - - - - - 3    Fall Risk Fall Risk  10/08/2021 08/13/2020 08/08/2019 06/28/2018 06/27/2017  Falls in the past year? 0 0 0 No Yes  Number falls in past yr: 0 0 0 - 1  Injury with Fall? 0 0 0 - No  Risk for fall due to : - No Fall Risks - - -  Follow up Falls evaluation completed Falls evaluation completed;Education provided - - -    FALL RISK PREVENTION PERTAINING TO THE HOME:  Any stairs in or around the home? Yes  If so, are there any without handrails? No  Home free of loose throw rugs in walkways, pet beds, electrical cords, etc? Yes  Adequate lighting in your home to reduce risk of falls? Yes   ASSISTIVE DEVICES UTILIZED TO PREVENT FALLS:  Life alert? No  Use of a cane, walker or w/c? No  Grab bars in the bathroom? Yes  Shower chair or bench in shower? Yes  Elevated toilet seat or a handicapped toilet? Yes   Cognitive Function: Normal cognitive status assessed by direct observation by this Nurse Health Advisor. No abnormalities found.   MMSE  - Mini  Mental State Exam 06/28/2018  Orientation to time 5  Orientation to Place 5  Registration 3  Attention/ Calculation 5  Recall 3  Language- name 2 objects 2  Language- repeat 1  Language- follow 3 step command 3  Language- read & follow direction 1  Write a sentence 1  Copy design 1  Total score 30     6CIT Screen 08/13/2020  What Year? 0 points  What month? 0 points  What time? 0 points  Count back from 20 0 points  Months in reverse 0 points  Repeat phrase 0 points  Total Score 0    Immunizations Immunization History  Administered Date(s) Administered   Fluad Quad(high Dose 65+) 09/13/2021   H1N1 10/30/2008   Influenza Whole 11/30/2009   Influenza, High Dose Seasonal PF 11/25/2016, 09/07/2017   Influenza, Seasonal, Injecte, Preservative Fre 08/31/2010, 10/16/2012   Influenza,inj,Quad PF,6+ Mos 11/28/2013   Influenza-Unspecified 09/28/2004, 09/04/2006, 10/15/2007, 10/16/2012, 07/22/2013, 11/04/2016, 08/24/2017   Moderna Sars-Covid-2 Vaccination 11/29/2019, 01/01/2020, 09/02/2020   Pneumococcal Conjugate-13 03/24/2015   Pneumococcal Polysaccharide-23 09/23/2010, 11/21/2010   Td 11/21/2010   Tdap 11/21/2009   Zoster Recombinat (Shingrix) 01/10/2019, 03/25/2019   Zoster, Live 10/25/2013, 11/21/2013    TDAP status: Due, Education has been provided regarding the importance of this vaccine. Advised may receive this vaccine at local pharmacy or Health Dept. Aware to provide a copy of the vaccination record if obtained from local pharmacy or Health Dept. Verbalized acceptance and understanding.  Flu Vaccine status: Up to date  Pneumococcal vaccine status: Up to date  Covid-19 vaccine status: Completed vaccines  Qualifies for Shingles Vaccine? Yes   Zostavax completed No   Shingrix Completed?: Yes  Screening Tests Health Maintenance  Topic Date Due   Hepatitis C Screening  Never done   COVID-19 Vaccine (4 - Booster for Moderna series) 10/28/2020   TETANUS/TDAP   11/21/2020   Pneumonia Vaccine 25+ Years old  Completed   INFLUENZA VACCINE  Completed   Zoster Vaccines- Shingrix  Completed   HPV VACCINES  Aged Out   COLONOSCOPY (Pts 45-35yrs Insurance coverage will need to be confirmed)  Discontinued    Health Maintenance  Health Maintenance Due  Topic Date Due   Hepatitis C Screening  Never done   COVID-19 Vaccine (4 - Booster for Moderna series) 10/28/2020   TETANUS/TDAP  11/21/2020    Colorectal cancer screening: No longer required.   Lung Cancer Screening: (Low Dose CT Chest recommended if Age 37-80 years, 30 pack-year currently smoking OR have quit w/in 15years.) does not qualify.   Lung Cancer Screening Referral: n/a  Additional Screening:  Hepatitis C Screening: does not qualify;   Vision Screening: Recommended annual ophthalmology exams for early detection of glaucoma and other disorders of the eye. Is the patient up to date with their annual eye exam?  Yes  Who is the provider or what is the name of the office in which the patient attends annual eye exams? Dr.Groat  If pt is not established with a provider, would they like to be referred to a provider to establish care? No .   Dental Screening: Recommended annual dental exams for proper oral hygiene  Community Resource Referral / Chronic Care Management: CRR required this visit?  No   CCM required this visit?  No      Plan:     I have personally reviewed and noted the following in the patient's chart:   Medical and social history Use of alcohol, tobacco or illicit  drugs  Current medications and supplements including opioid prescriptions. Patient is not currently taking opioid prescriptions. Functional ability and status Nutritional status Physical activity Advanced directives List of other physicians Hospitalizations, surgeries, and ER visits in previous 12 months Vitals Screenings to include cognitive, depression, and falls Referrals and appointments  In  addition, I have reviewed and discussed with patient certain preventive protocols, quality metrics, and best practice recommendations. A written personalized care plan for preventive services as well as general preventive health recommendations were provided to patient.     March Rummage, LPN   [id number]   Nurse Notes: none   Medical screening examination/treatment/procedure(s) were performed by non-physician practitioner and as supervising physician I was immediately available for consultation/collaboration.  I agree with above. Jacinta Shoe, MD

## 2021-10-08 NOTE — Patient Instructions (Signed)
Blake Zamora , Thank you for taking time to come for your Medicare Wellness Visit. I appreciate your ongoing commitment to your health goals. Please review the following plan we discussed and let me know if I can assist you in the future.   Screening recommendations/referrals: Colonoscopy: no longer required  Recommended yearly ophthalmology/optometry visit for glaucoma screening and checkup Recommended yearly dental visit for hygiene and checkup  Vaccinations: Influenza vaccine: completed  Pneumococcal vaccine: completed  Tdap vaccine: 11/21/2010  due will obtain  Shingles vaccine: completed     Advanced directives: will provide copies   Conditions/risks identified: none   Next appointment: none   Preventive Care 65 Years and Older, Male Preventive care refers to lifestyle choices and visits with your health care provider that can promote health and wellness. What does preventive care include? A yearly physical exam. This is also called an annual well check. Dental exams once or twice a year. Routine eye exams. Ask your health care provider how often you should have your eyes checked. Personal lifestyle choices, including: Daily care of your teeth and gums. Regular physical activity. Eating a healthy diet. Avoiding tobacco and drug use. Limiting alcohol use. Practicing safe sex. Taking low doses of aspirin every day. Taking vitamin and mineral supplements as recommended by your health care provider. What happens during an annual well check? The services and screenings done by your health care provider during your annual well check will depend on your age, overall health, lifestyle risk factors, and family history of disease. Counseling  Your health care provider may ask you questions about your: Alcohol use. Tobacco use. Drug use. Emotional well-being. Home and relationship well-being. Sexual activity. Eating habits. History of falls. Memory and ability to understand  (cognition). Work and work Astronomer. Screening  You may have the following tests or measurements: Height, weight, and BMI. Blood pressure. Lipid and cholesterol levels. These may be checked every 5 years, or more frequently if you are over 4 years old. Skin check. Lung cancer screening. You may have this screening every year starting at age 74 if you have a 30-pack-year history of smoking and currently smoke or have quit within the past 15 years. Fecal occult blood test (FOBT) of the stool. You may have this test every year starting at age 70. Flexible sigmoidoscopy or colonoscopy. You may have a sigmoidoscopy every 5 years or a colonoscopy every 10 years starting at age 34. Prostate cancer screening. Recommendations will vary depending on your family history and other risks. Hepatitis C blood test. Hepatitis B blood test. Sexually transmitted disease (STD) testing. Diabetes screening. This is done by checking your blood sugar (glucose) after you have not eaten for a while (fasting). You may have this done every 1-3 years. Abdominal aortic aneurysm (AAA) screening. You may need this if you are a current or former smoker. Osteoporosis. You may be screened starting at age 65 if you are at high risk. Talk with your health care provider about your test results, treatment options, and if necessary, the need for more tests. Vaccines  Your health care provider may recommend certain vaccines, such as: Influenza vaccine. This is recommended every year. Tetanus, diphtheria, and acellular pertussis (Tdap, Td) vaccine. You may need a Td booster every 10 years. Zoster vaccine. You may need this after age 23. Pneumococcal 13-valent conjugate (PCV13) vaccine. One dose is recommended after age 48. Pneumococcal polysaccharide (PPSV23) vaccine. One dose is recommended after age 47. Talk to your health care provider about which  screenings and vaccines you need and how often you need them. This  information is not intended to replace advice given to you by your health care provider. Make sure you discuss any questions you have with your health care provider. Document Released: 12/04/2015 Document Revised: 07/27/2016 Document Reviewed: 09/08/2015 Elsevier Interactive Patient Education  2017 [place] Prevention in the Home Falls can cause injuries. They can happen to people of all ages. There are many things you can do to make your home safe and to help prevent falls. What can I do on the outside of my home? Regularly fix the edges of walkways and driveways and fix any cracks. Remove anything that might make you trip as you walk through a door, such as a raised step or threshold. Trim any bushes or trees on the path to your home. Use bright outdoor lighting. Clear any walking paths of anything that might make someone trip, such as rocks or tools. Regularly check to see if handrails are loose or broken. Make sure that both sides of any steps have handrails. Any raised decks and porches should have guardrails on the edges. Have any leaves, snow, or ice cleared regularly. Use sand or salt on walking paths during winter. Clean up any spills in your garage right away. This includes oil or grease spills. What can I do in the bathroom? Use night lights. Install grab bars by the toilet and in the tub and shower. Do not use towel bars as grab bars. Use non-skid mats or decals in the tub or shower. If you need to sit down in the shower, use a plastic, non-slip stool. Keep the floor dry. Clean up any water that spills on the floor as soon as it happens. Remove soap buildup in the tub or shower regularly. Attach bath mats securely with double-sided non-slip rug tape. Do not have throw rugs and other things on the floor that can make you trip. What can I do in the bedroom? Use night lights. Make sure that you have a light by your bed that is easy to reach. Do not use any sheets or  blankets that are too big for your bed. They should not hang down onto the floor. Have a firm chair that has side arms. You can use this for support while you get dressed. Do not have throw rugs and other things on the floor that can make you trip. What can I do in the kitchen? Clean up any spills right away. Avoid walking on wet floors. Keep items that you use a lot in easy-to-reach places. If you need to reach something above you, use a strong step stool that has a grab bar. Keep electrical cords out of the way. Do not use floor polish or wax that makes floors slippery. If you must use wax, use non-skid floor wax. Do not have throw rugs and other things on the floor that can make you trip. What can I do with my stairs? Do not leave any items on the stairs. Make sure that there are handrails on both sides of the stairs and use them. Fix handrails that are broken or loose. Make sure that handrails are as long as the stairways. Check any carpeting to make sure that it is firmly attached to the stairs. Fix any carpet that is loose or worn. Avoid having throw rugs at the top or bottom of the stairs. If you do have throw rugs, attach them to the floor with carpet  tape. Make sure that you have a light switch at the top of the stairs and the bottom of the stairs. If you do not have them, ask someone to add them for you. What else can I do to help prevent falls? Wear shoes that: Do not have high heels. Have rubber bottoms. Are comfortable and fit you well. Are closed at the toe. Do not wear sandals. If you use a stepladder: Make sure that it is fully opened. Do not climb a closed stepladder. Make sure that both sides of the stepladder are locked into place. Ask someone to hold it for you, if possible. Clearly mark and make sure that you can see: Any grab bars or handrails. First and last steps. Where the edge of each step is. Use tools that help you move around (mobility aids) if they are  needed. These include: Canes. Walkers. Scooters. Crutches. Turn on the lights when you go into a dark area. Replace any light bulbs as soon as they burn out. Set up your furniture so you have a clear path. Avoid moving your furniture around. If any of your floors are uneven, fix them. If there are any pets around you, be aware of where they are. Review your medicines with your doctor. Some medicines can make you feel dizzy. This can increase your chance of falling. Ask your doctor what other things that you can do to help prevent falls. This information is not intended to replace advice given to you by your health care provider. Make sure you discuss any questions you have with your health care provider. Document Released: 09/03/2009 Document Revised: 04/14/2016 Document Reviewed: 12/12/2014 Elsevier Interactive Patient Education  2017 [employer].

## 2022-06-08 DIAGNOSIS — H40012 Open angle with borderline findings, low risk, left eye: Secondary | ICD-10-CM | POA: Diagnosis not present

## 2022-06-08 DIAGNOSIS — H2513 Age-related nuclear cataract, bilateral: Secondary | ICD-10-CM | POA: Diagnosis not present

## 2022-06-08 DIAGNOSIS — H0102B Squamous blepharitis left eye, upper and lower eyelids: Secondary | ICD-10-CM | POA: Diagnosis not present

## 2022-06-08 DIAGNOSIS — E119 Type 2 diabetes mellitus without complications: Secondary | ICD-10-CM | POA: Diagnosis not present

## 2022-06-08 DIAGNOSIS — H11421 Conjunctival edema, right eye: Secondary | ICD-10-CM | POA: Diagnosis not present

## 2022-06-08 DIAGNOSIS — H04123 Dry eye syndrome of bilateral lacrimal glands: Secondary | ICD-10-CM | POA: Diagnosis not present

## 2022-06-08 DIAGNOSIS — H0102A Squamous blepharitis right eye, upper and lower eyelids: Secondary | ICD-10-CM | POA: Diagnosis not present

## 2022-06-08 DIAGNOSIS — H401112 Primary open-angle glaucoma, right eye, moderate stage: Secondary | ICD-10-CM | POA: Diagnosis not present

## 2022-08-29 ENCOUNTER — Telehealth: Payer: Medicare Other | Admitting: Physician Assistant

## 2022-08-29 DIAGNOSIS — U071 COVID-19: Secondary | ICD-10-CM | POA: Diagnosis not present

## 2022-08-29 MED ORDER — MOLNUPIRAVIR EUA 200MG CAPSULE: 4.0000 | ORAL_CAPSULE | Freq: Two times a day (BID) | ORAL | 0 refills | Status: AC

## 2022-08-29 MED ORDER — BENZONATATE 100 MG PO CAPS: 100.0000 mg | ORAL_CAPSULE | Freq: Three times a day (TID) | ORAL | 0 refills | Status: AC | PRN

## 2022-08-29 NOTE — Addendum Note (Signed)
Addended by: Brunetta Jeans on: 08/29/2022 09:49 AM   Modules accepted: Level of Service

## 2022-08-29 NOTE — Progress Notes (Signed)
Virtual Visit Consent   Blake Zamora, you are scheduled for a virtual visit with a [facility] provider today. Just as with appointments in the office, your consent must be obtained to participate. Your consent will be active for this visit and any virtual visit you may have with one of our providers in the next 365 days. If you have a MyChart account, a copy of this consent can be sent to you electronically.  As this is a virtual visit, video technology does not allow for your provider to perform a traditional examination. This may limit your provider's ability to fully assess your condition. If your provider identifies any concerns that need to be evaluated in person or the need to arrange testing (such as labs, EKG, etc.), we will make arrangements to do so. Although advances in technology are sophisticated, we cannot ensure that it will always work on either your end or our end. If the connection with a video visit is poor, the visit may have to be switched to a telephone visit. With either a video or telephone visit, we are not always able to ensure that we have a secure connection.  By engaging in this virtual visit, you consent to the provision of healthcare and authorize for your insurance to be billed (if applicable) for the services provided during this visit. Depending on your insurance coverage, you may receive a charge related to this service.  I need to obtain your verbal consent now. Are you willing to proceed with your visit today? Blake Zamora has provided verbal consent on 08/29/2022 for a virtual visit (video or telephone). Piedad Climes, [place]  Date: 08/29/2022 9:45 AM  Virtual Visit via Video Note   I, Piedad Climes, PA-C, attempted to connect with Blake Zamora; MRN [id number] on 08/29/22 via [facility] to complete a video urgent care visit. The patient was unable to successfully connect to the video platform. As such, the patient was contacted by this provider via  phone to complete the encounter.   Location: Patient: Virtual Visit Location Patient: Home Provider: Virtual Visit Location Provider: Home Office   I discussed the limitations of evaluation and management by telemedicine and the availability of in person appointments. The patient expressed understanding and agreed to proceed.    History of Present Illness: Blake Zamora is a 81 y.o. who identifies as a male who was assigned male at birth, and is being seen today for COVID-19.  HPI: HPI  Problems:  Patient Active Problem List   Diagnosis Date Noted   Edema 09/21/2021   Rash 09/21/2021   Cough 06/15/2020   Colon polyp 06/15/2020   Thyroid nodule 11/02/2017   Elevated glucose 07/12/2017   Knee pain, right 07/12/2017   LBP (low back pain) 11/28/2013   Pterygium eye 10/16/2012   Well adult exam 10/03/2011   Elevated blood pressure 10/03/2011   FREQUENCY, URINARY 01/27/2011   BLURRED VISION 12/30/2010   DIZZINESS 12/30/2010   CONCUSSION 12/30/2010   BENIGN PROSTATIC HYPERTROPHY 09/23/2010   Hypothyroidism 10/30/2008   PARESTHESIA 10/30/2008    Allergies: No Known Allergies Medications:  Current Outpatient Medications:    benzonatate (TESSALON) 100 MG capsule, Take 1 capsule (100 mg total) by mouth 3 (three) times daily as needed for cough., Disp: 30 capsule, Rfl: 0   molnupiravir EUA (LAGEVRIO) 200 mg CAPS capsule, Take 4 capsules (800 mg total) by mouth 2 (two) times daily for 5 days., Disp: 40 capsule, Rfl: 0   amLODipine (  NORVASC) 2.5 MG tablet, Take 2.5 mg by mouth daily., Disp: , Rfl:    aspirin 81 MG EC tablet, Take 81 mg by mouth daily.  , Disp: , Rfl:    Cholecalciferol (VITAMIN D3) 1000 UNITS tablet, Take 1,000 Units by mouth daily.  , Disp: , Rfl:    furosemide (LASIX) 20 MG tablet, Take 1 tablet (20 mg total) by mouth daily as needed. (Patient not taking: Reported on 10/08/2021), Disp: 30 tablet, Rfl: 3   gabapentin (NEURONTIN) 300 MG capsule, Take 300 mg by mouth  every other day., Disp: , Rfl:    levothyroxine (SYNTHROID, LEVOTHROID) 112 MCG tablet, TAKE 1 TABLET BY MOUTH DAILY, Disp: 90 tablet, Rfl: 3   metFORMIN (GLUCOPHAGE) 500 MG tablet, Take 500 mg by mouth every other day., Disp: , Rfl:    predniSONE (DELTASONE) 20 MG tablet, Take 2 tablets (40 mg total) by mouth daily. (Patient not taking: Reported on 10/08/2021), Disp: 10 tablet, Rfl: 0   tamsulosin (FLOMAX) 0.4 MG CAPS capsule, Take 0.4 mg by mouth daily., Disp: , Rfl:    triamcinolone cream (KENALOG) 0.1 %, Apply 1 application topically 3 (three) times daily as needed. Rash, Disp: 80 g, Rfl: 3   vitamin B-12 (CYANOCOBALAMIN) 1000 MCG tablet, Take 1,000 mcg by mouth every 3 (three) days. , Disp: , Rfl:   Observations/Objective: No labored breathing. Speech is clear and coherent with logical content.  Patient is alert and oriented at baseline.   Assessment and Plan: 1. COVID-19 - molnupiravir EUA (LAGEVRIO) 200 mg CAPS capsule; Take 4 capsules (800 mg total) by mouth 2 (two) times daily for 5 days.  Dispense: 40 capsule; Refill: 0 - benzonatate (TESSALON) 100 MG capsule; Take 1 capsule (100 mg total) by mouth 3 (three) times daily as needed for cough.  Dispense: 30 capsule; Refill: 0  Patient with multiple risk factors for complicated course of illness. Discussed risks/benefits of antiviral medications including most common potential ADRs. Patient voiced understanding and would like to proceed with antiviral medication. They are candidate for molnupiravir. Rx sent to pharmacy. Supportive measures, OTC medications and vitamin regimen reviewed. Tessalon per orders. Patient has been enrolled in a MyChart COVID symptom monitoring program. Samule Dry reviewed in detail. Strict ER precautions discussed with patient.    Follow Up Instructions: I discussed the assessment and treatment plan with the patient. The patient was provided an opportunity to ask questions and all were answered. The patient  agreed with the plan and demonstrated an understanding of the instructions.  A copy of instructions were sent to the patient via MyChart unless otherwise noted below.    The patient was advised to call back or seek an in-person evaluation if the symptoms worsen or if the condition fails to improve as anticipated.  Time:  I spent 10 minutes with the patient via telehealth technology discussing the above problems/concerns.    Leeanne Rio, PA-C

## 2022-08-29 NOTE — Patient Instructions (Signed)
Blake Zamora, thank you for joining Piedad Climes, PA-C for today's virtual visit.  While this provider is not your primary care provider (PCP), if your PCP is located in our provider database this encounter information will be shared with them immediately following your visit.  Consent: (Patient) Blake Zamora provided verbal consent for this virtual visit at the beginning of the encounter.  Current Medications:  Current Outpatient Medications:    amLODipine (NORVASC) 2.5 MG tablet, Take 2.5 mg by mouth daily., Disp: , Rfl:    aspirin 81 MG EC tablet, Take 81 mg by mouth daily.  , Disp: , Rfl:    Cholecalciferol (VITAMIN D3) 1000 UNITS tablet, Take 1,000 Units by mouth daily.  , Disp: , Rfl:    furosemide (LASIX) 20 MG tablet, Take 1 tablet (20 mg total) by mouth daily as needed. (Patient not taking: Reported on 10/08/2021), Disp: 30 tablet, Rfl: 3   gabapentin (NEURONTIN) 300 MG capsule, Take 300 mg by mouth every other day., Disp: , Rfl:    levothyroxine (SYNTHROID, LEVOTHROID) 112 MCG tablet, TAKE 1 TABLET BY MOUTH DAILY, Disp: 90 tablet, Rfl: 3   metFORMIN (GLUCOPHAGE) 500 MG tablet, Take 500 mg by mouth every other day., Disp: , Rfl:    predniSONE (DELTASONE) 20 MG tablet, Take 2 tablets (40 mg total) by mouth daily. (Patient not taking: Reported on 10/08/2021), Disp: 10 tablet, Rfl: 0   tamsulosin (FLOMAX) 0.4 MG CAPS capsule, Take 0.4 mg by mouth daily., Disp: , Rfl:    triamcinolone cream (KENALOG) 0.1 %, Apply 1 application topically 3 (three) times daily as needed. Rash, Disp: 80 g, Rfl: 3   vitamin B-12 (CYANOCOBALAMIN) 1000 MCG tablet, Take 1,000 mcg by mouth every 3 (three) days. , Disp: , Rfl:    Medications ordered in this encounter:  No orders of the defined types were placed in this encounter.    *If you need refills on other medications prior to your next appointment, please contact your pharmacy*  Follow-Up: Call back or seek an in-person evaluation if the  symptoms worsen or if the condition fails to improve as anticipated.  [facility] [phone]  Other Instructions Please keep well-hydrated and get plenty of rest. Start a saline nasal rinse to flush out your nasal passages. You can use plain Mucinex to help thin congestion. If you have a humidifier, running in the bedroom at night. I want you to start OTC vitamin D3 1000 units daily, vitamin C 1000 mg daily, and a zinc supplement. Please take prescribed medications as directed.  You have been enrolled in a MyChart symptom monitoring program. Please answer these questions daily so we can keep track of how you are doing.  You were to quarantine for 5 days from onset of your symptoms.  After day 5, if you have had no fever and you are feeling better, you can end quarantine but need to mask for an additional 5 days. After day 5 if you have a fever or are having significant symptoms, please quarantine for full 10 days.  If you note any worsening of symptoms, any significant shortness of breath or any chest pain, please seek ER evaluation ASAP.  Please do not delay care!  COVID-19: What to Do if You Are Sick If you test positive and are an older adult or someone who is at high risk of getting very sick from COVID-19, treatment may be available. Contact a healthcare provider right away after a positive  test to determine if you are eligible, even if your symptoms are mild right now. You can also visit a Test to Treat location and, if eligible, receive a prescription from a provider. Don't delay: Treatment must be started within the first few days to be effective. If you have a fever, cough, or other symptoms, you might have COVID-19. Most people have mild illness and are able to recover at home. If you are sick: Keep track of your symptoms. If you have an emergency warning sign (including trouble breathing), call 911. Steps to help prevent the spread of COVID-19 if you are sick If  you are sick with COVID-19 or think you might have COVID-19, follow the steps below to care for yourself and to help protect other people in your home and community. Stay home except to get medical care Stay home. Most people with COVID-19 have mild illness and can recover at home without medical care. Do not leave your home, except to get medical care. Do not visit public areas and do not go to places where you are unable to wear a mask. Take care of yourself. Get rest and stay hydrated. Take over-the-counter medicines, such as acetaminophen, to help you feel better. Stay in touch with your doctor. Call before you get medical care. Be sure to get care if you have trouble breathing, or have any other emergency warning signs, or if you think it is an emergency. Avoid public transportation, ride-sharing, or taxis if possible. Get tested If you have symptoms of COVID-19, get tested. While waiting for test results, stay away from others, including staying apart from those living in your household. Get tested as soon as possible after your symptoms start. Treatments may be available for people with COVID-19 who are at risk for becoming very sick. Don't delay: Treatment must be started early to be effective--some treatments must begin within 5 days of your first symptoms. Contact your healthcare provider right away if your test result is positive to determine if you are eligible. Self-tests are one of several options for testing for the virus that causes COVID-19 and may be more convenient than laboratory-based tests and point-of-care tests. Ask your healthcare provider or your local health department if you need help interpreting your test results. You can visit your state, tribal, local, and territorial health department's website to look for the latest local information on testing sites. Separate yourself from other people As much as possible, stay in a specific room and away from other people and pets in  your home. If possible, you should use a separate bathroom. If you need to be around other people or animals in or outside of the home, wear a well-fitting mask. Tell your close contacts that they may have been exposed to COVID-19. An infected person can spread COVID-19 starting 48 hours (or 2 days) before the person has any symptoms or tests positive. By letting your close contacts know they may have been exposed to COVID-19, you are helping to protect everyone. See COVID-19 and Animals if you have questions about pets. If you are diagnosed with COVID-19, someone from the health department may call you. Answer the call to slow the spread. Monitor your symptoms Symptoms of COVID-19 include fever, cough, or other symptoms. Follow care instructions from your healthcare provider and local health department. Your local health authorities may give instructions on checking your symptoms and reporting information. When to seek emergency medical attention Look for emergency warning signs* for COVID-19. If someone is  showing any of these signs, seek emergency medical care immediately: Trouble breathing Persistent pain or pressure in the chest New confusion Inability to wake or stay awake Pale, gray, or blue-colored skin, lips, or nail beds, depending on skin tone *This list is not all possible symptoms. Please call your medical provider for any other symptoms that are severe or concerning to you. Call 911 or call ahead to your local emergency facility: Notify the operator that you are seeking care for someone who has or may have COVID-19. Call ahead before visiting your doctor Call ahead. Many medical visits for routine care are being postponed or done by phone or telemedicine. If you have a medical appointment that cannot be postponed, call your doctor's office, and tell them you have or may have COVID-19. This will help the office protect themselves and other patients. If you are sick, wear a  well-fitting mask You should wear a mask if you must be around other people or animals, including pets (even at home). Wear a mask with the best fit, protection, and comfort for you. You don't need to wear the mask if you are alone. If you can't put on a mask (because of trouble breathing, for example), cover your coughs and sneezes in some other way. Try to stay at least 6 feet away from other people. This will help protect the people around you. Masks should not be placed on young children under age 34 years, anyone who has trouble breathing, or anyone who is not able to remove the mask without help. Cover your coughs and sneezes Cover your mouth and nose with a tissue when you cough or sneeze. Throw away used tissues in a lined trash can. Immediately wash your hands with soap and water for at least 20 seconds. If soap and water are not available, clean your hands with an alcohol-based hand sanitizer that contains at least 60% alcohol. Clean your hands often Wash your hands often with soap and water for at least 20 seconds. This is especially important after blowing your nose, coughing, or sneezing; going to the bathroom; and before eating or preparing food. Use hand sanitizer if soap and water are not available. Use an alcohol-based hand sanitizer with at least 60% alcohol, covering all surfaces of your hands and rubbing them together until they feel dry. Soap and water are the best option, especially if hands are visibly dirty. Avoid touching your eyes, nose, and mouth with unwashed hands. Handwashing Tips Avoid sharing personal household items Do not share dishes, drinking glasses, cups, eating utensils, towels, or bedding with other people in your home. Wash these items thoroughly after using them with soap and water or put in the dishwasher. Clean surfaces in your home regularly Clean and disinfect high-touch surfaces (for example, doorknobs, tables, handles, light switches, and countertops)  in your "sick room" and bathroom. In shared spaces, you should clean and disinfect surfaces and items after each use by the person who is ill. If you are sick and cannot clean, a caregiver or other person should only clean and disinfect the area around you (such as your bedroom and bathroom) on an as needed basis. Your caregiver/other person should wait as long as possible (at least several hours) and wear a mask before entering, cleaning, and disinfecting shared spaces that you use. Clean and disinfect areas that may have blood, stool, or body fluids on them. Use household cleaners and disinfectants. Clean visible dirty surfaces with household cleaners containing soap or detergent. Then,  use a household disinfectant. Use a product from [employer] List N: Disinfectants for Coronavirus (COVID-19). Be sure to follow the instructions on the label to ensure safe and effective use of the product. Many products recommend keeping the surface wet with a disinfectant for a certain period of time (look at "contact time" on the product label). You may also need to wear personal protective equipment, such as gloves, depending on the directions on the product label. Immediately after disinfecting, wash your hands with soap and water for 20 seconds. For completed guidance on cleaning and disinfecting your home, visit Complete Disinfection Guidance. Take steps to improve ventilation at home Improve ventilation (air flow) at home to help prevent from spreading COVID-19 to other people in your household. Clear out COVID-19 virus particles in the air by opening windows, using air filters, and turning on fans in your home. Use this interactive tool to learn how to improve air flow in your home. When you can be around others after being sick with COVID-19 Deciding when you can be around others is different for different situations. Find out when you can safely end home isolation. For any additional questions about your care,  contact your healthcare provider or state or local health department. 02/09/2021 Content source: [facility] ([facility]), Division of Viral Diseases This information is not intended to replace advice given to you by your health care provider. Make sure you discuss any questions you have with your health care provider. Document Revised: 03/25/2021 Document Reviewed: 03/25/2021 Elsevier Patient Education  2022 [employer].      If you have been instructed to have an in-person evaluation today at a local Urgent Care facility, please use the link below. It will take you to a list of all of our available [facility], including address, phone number and hours of operation. Please do not delay care.  [facility]  If you or a family member do not have a primary care provider, use the link below to schedule a visit and establish care. When you choose a [facility] primary care physician or advanced practice provider, you gain a long-term partner in health. Find a Primary Care Provider  Learn more about [facility]'s in-office and virtual care options: [facility] - Get Care Now

## 2022-09-07 DIAGNOSIS — Z1283 Encounter for screening for malignant neoplasm of skin: Secondary | ICD-10-CM | POA: Diagnosis not present

## 2022-09-07 DIAGNOSIS — L918 Other hypertrophic disorders of the skin: Secondary | ICD-10-CM | POA: Diagnosis not present

## 2022-09-07 DIAGNOSIS — I872 Venous insufficiency (chronic) (peripheral): Secondary | ICD-10-CM | POA: Diagnosis not present

## 2022-09-07 DIAGNOSIS — L209 Atopic dermatitis, unspecified: Secondary | ICD-10-CM | POA: Diagnosis not present

## 2022-10-17 ENCOUNTER — Ambulatory Visit (INDEPENDENT_AMBULATORY_CARE_PROVIDER_SITE_OTHER): Payer: Medicare Other

## 2022-10-17 VITALS — Ht 73.0 in | Wt 230.0 lb

## 2022-10-17 DIAGNOSIS — Z0000 Encounter for general adult medical examination without abnormal findings: Secondary | ICD-10-CM | POA: Diagnosis not present

## 2022-10-17 DIAGNOSIS — R040 Epistaxis: Secondary | ICD-10-CM | POA: Diagnosis not present

## 2022-10-17 NOTE — Patient Instructions (Signed)
Blake Zamora , Thank you for taking time to come for your Medicare Wellness Visit. I appreciate your ongoing commitment to your health goals. Please review the following plan we discussed and let me know if I can assist you in the future.   These are the goals we discussed:  Goals      MY GOAL IS TO GET MORE EXERCISE AND TO CONTINUE TO TAKE CARE OF MY WIFE.        This is a list of the screening recommended for you and due dates:  Health Maintenance  Topic Date Due   Hepatitis C Screening: USPSTF Recommendation to screen - Ages 41-79 yo.  Never done   Flu Shot  06/21/2022   COVID-19 Vaccine (6 - 2023-24 season) 07/22/2022   Medicare Annual Wellness Visit  10/18/2023   Pneumonia Vaccine  Completed   Zoster (Shingles) Vaccine  Completed   HPV Vaccine  Aged Out   Colon Cancer Screening  Discontinued    Advanced directives: YES  Conditions/risks identified: YES; TYPE II DIABETES MELLITUS  Next appointment: Follow up in one year for your annual wellness visit.   Preventive Care 79 Years and Older, Male  Preventive care refers to lifestyle choices and visits with your health care provider that can promote health and wellness. What does preventive care include? A yearly physical exam. This is also called an annual well check. Dental exams once or twice a year. Routine eye exams. Ask your health care provider how often you should have your eyes checked. Personal lifestyle choices, including: Daily care of your teeth and gums. Regular physical activity. Eating a healthy diet. Avoiding tobacco and drug use. Limiting alcohol use. Practicing safe sex. Taking low doses of aspirin every day. Taking vitamin and mineral supplements as recommended by your health care provider. What happens during an annual well check? The services and screenings done by your health care provider during your annual well check will depend on your age, overall health, lifestyle risk factors, and family  history of disease. Counseling  Your health care provider may ask you questions about your: Alcohol use. Tobacco use. Drug use. Emotional well-being. Home and relationship well-being. Sexual activity. Eating habits. History of falls. Memory and ability to understand (cognition). Work and work Astronomer. Screening  You may have the following tests or measurements: Height, weight, and BMI. Blood pressure. Lipid and cholesterol levels. These may be checked every 5 years, or more frequently if you are over 70 years old. Skin check. Lung cancer screening. You may have this screening every year starting at age 62 if you have a 30-pack-year history of smoking and currently smoke or have quit within the past 15 years. Fecal occult blood test (FOBT) of the stool. You may have this test every year starting at age 62. Flexible sigmoidoscopy or colonoscopy. You may have a sigmoidoscopy every 5 years or a colonoscopy every 10 years starting at age 46. Prostate cancer screening. Recommendations will vary depending on your family history and other risks. Hepatitis C blood test. Hepatitis B blood test. Sexually transmitted disease (STD) testing. Diabetes screening. This is done by checking your blood sugar (glucose) after you have not eaten for a while (fasting). You may have this done every 1-3 years. Abdominal aortic aneurysm (AAA) screening. You may need this if you are a current or former smoker. Osteoporosis. You may be screened starting at age 84 if you are at high risk. Talk with your health care provider about your test results,  treatment options, and if necessary, the need for more tests. Vaccines  Your health care provider may recommend certain vaccines, such as: Influenza vaccine. This is recommended every year. Tetanus, diphtheria, and acellular pertussis (Tdap, Td) vaccine. You may need a Td booster every 10 years. Zoster vaccine. You may need this after age 18. Pneumococcal  13-valent conjugate (PCV13) vaccine. One dose is recommended after age 62. Pneumococcal polysaccharide (PPSV23) vaccine. One dose is recommended after age 76. Talk to your health care provider about which screenings and vaccines you need and how often you need them. This information is not intended to replace advice given to you by your health care provider. Make sure you discuss any questions you have with your health care provider. Document Released: 12/04/2015 Document Revised: 07/27/2016 Document Reviewed: 09/08/2015 Elsevier Interactive Patient Education  2017 [place] Prevention in the Home Falls can cause injuries. They can happen to people of all ages. There are many things you can do to make your home safe and to help prevent falls. What can I do on the outside of my home? Regularly fix the edges of walkways and driveways and fix any cracks. Remove anything that might make you trip as you walk through a door, such as a raised step or threshold. Trim any bushes or trees on the path to your home. Use bright outdoor lighting. Clear any walking paths of anything that might make someone trip, such as rocks or tools. Regularly check to see if handrails are loose or broken. Make sure that both sides of any steps have handrails. Any raised decks and porches should have guardrails on the edges. Have any leaves, snow, or ice cleared regularly. Use sand or salt on walking paths during winter. Clean up any spills in your garage right away. This includes oil or grease spills. What can I do in the bathroom? Use night lights. Install grab bars by the toilet and in the tub and shower. Do not use towel bars as grab bars. Use non-skid mats or decals in the tub or shower. If you need to sit down in the shower, use a plastic, non-slip stool. Keep the floor dry. Clean up any water that spills on the floor as soon as it happens. Remove soap buildup in the tub or shower regularly. Attach  bath mats securely with double-sided non-slip rug tape. Do not have throw rugs and other things on the floor that can make you trip. What can I do in the bedroom? Use night lights. Make sure that you have a light by your bed that is easy to reach. Do not use any sheets or blankets that are too big for your bed. They should not hang down onto the floor. Have a firm chair that has side arms. You can use this for support while you get dressed. Do not have throw rugs and other things on the floor that can make you trip. What can I do in the kitchen? Clean up any spills right away. Avoid walking on wet floors. Keep items that you use a lot in easy-to-reach places. If you need to reach something above you, use a strong step stool that has a grab bar. Keep electrical cords out of the way. Do not use floor polish or wax that makes floors slippery. If you must use wax, use non-skid floor wax. Do not have throw rugs and other things on the floor that can make you trip. What can I do with my stairs? Do  not leave any items on the stairs. Make sure that there are handrails on both sides of the stairs and use them. Fix handrails that are broken or loose. Make sure that handrails are as long as the stairways. Check any carpeting to make sure that it is firmly attached to the stairs. Fix any carpet that is loose or worn. Avoid having throw rugs at the top or bottom of the stairs. If you do have throw rugs, attach them to the floor with carpet tape. Make sure that you have a light switch at the top of the stairs and the bottom of the stairs. If you do not have them, ask someone to add them for you. What else can I do to help prevent falls? Wear shoes that: Do not have high heels. Have rubber bottoms. Are comfortable and fit you well. Are closed at the toe. Do not wear sandals. If you use a stepladder: Make sure that it is fully opened. Do not climb a closed stepladder. Make sure that both sides of the  stepladder are locked into place. Ask someone to hold it for you, if possible. Clearly mark and make sure that you can see: Any grab bars or handrails. First and last steps. Where the edge of each step is. Use tools that help you move around (mobility aids) if they are needed. These include: Canes. Walkers. Scooters. Crutches. Turn on the lights when you go into a dark area. Replace any light bulbs as soon as they burn out. Set up your furniture so you have a clear path. Avoid moving your furniture around. If any of your floors are uneven, fix them. If there are any pets around you, be aware of where they are. Review your medicines with your doctor. Some medicines can make you feel dizzy. This can increase your chance of falling. Ask your doctor what other things that you can do to help prevent falls. This information is not intended to replace advice given to you by your health care provider. Make sure you discuss any questions you have with your health care provider. Document Released: 09/03/2009 Document Revised: 04/14/2016 Document Reviewed: 12/12/2014 Elsevier Interactive Patient Education  2017 [employer].

## 2022-10-17 NOTE — Progress Notes (Cosign Needed)
Virtual Visit via Telephone Note  I connected with  Blake Zamora on 10/17/22 at  3:30 PM EST by telephone and verified that I am speaking with the correct person using two identifiers.  Location: Patient: HOME Provider: [facility] Persons participating in the virtual visit: patient/Nurse Health Advisor   I discussed the limitations, risks, security and privacy concerns of performing an evaluation and management service by telephone and the availability of in person appointments. The patient expressed understanding and agreed to proceed.  Interactive audio and video telecommunications were attempted between this nurse and patient, however failed, due to patient having technical difficulties OR patient did not have access to video capability.  We continued and completed visit with audio only.  Some vital signs may be absent or patient reported.   Blake Needy, LPN  Subjective:   Blake Zamora is a 81 y.o. male who presents for Medicare Annual/Subsequent preventive examination.  Review of Systems     Cardiac Risk Factors include: advanced age (>60men, >79 women);diabetes mellitus;dyslipidemia;family history of premature cardiovascular disease;hypertension;male gender;obesity (BMI >30kg/m2)     Objective:    Today's Vitals   10/17/22 1533  Weight: 230 lb (104.3 kg)  Height: 6\' 1"  (1.854 m)  PainSc: 0-No pain   Body mass index is 30.34 kg/m.     10/17/2022    3:55 PM 10/08/2021    1:14 PM 08/13/2020   11:11 AM 08/08/2019    9:44 AM 06/28/2018   11:38 AM 06/27/2017    9:19 AM 01/06/2014    1:44 PM  Advanced Directives  Does Patient Have a Medical Advance Directive? Yes Yes Yes Yes Yes No Patient has advance directive, copy not in chart  Type of Advance Directive Healthcare Power of [place];Living will Healthcare Power of [place];Living will Healthcare Power of [place];Living will Healthcare Power of [place];Living will Healthcare Power of [place];Living will     Does patient want to make changes to medical advance directive?   No - Patient declined      Copy of Healthcare Power of Attorney in Chart? No - copy requested No - copy requested No - copy requested No - copy requested     Would patient like information on creating a medical advance directive?      Yes (ED - Information included in AVS)     Current Medications (verified) Outpatient Encounter Medications as of 10/17/2022  Medication Sig   amLODipine (NORVASC) 2.5 MG tablet Take 2.5 mg by mouth daily.   aspirin 81 MG EC tablet Take 81 mg by mouth daily.     benzonatate (TESSALON) 100 MG capsule Take 1 capsule (100 mg total) by mouth 3 (three) times daily as needed for cough.   Cholecalciferol (VITAMIN D3) 1000 UNITS tablet Take 1,000 Units by mouth daily.     gabapentin (NEURONTIN) 300 MG capsule Take 300 mg by mouth every other day.   levothyroxine (SYNTHROID, LEVOTHROID) 112 MCG tablet TAKE 1 TABLET BY MOUTH DAILY   metFORMIN (GLUCOPHAGE) 500 MG tablet Take 500 mg by mouth every other day.   tamsulosin (FLOMAX) 0.4 MG CAPS capsule Take 0.4 mg by mouth daily.   triamcinolone cream (KENALOG) 0.1 % Apply 1 application topically 3 (three) times daily as needed. Rash   vitamin B-12 (CYANOCOBALAMIN) 1000 MCG tablet Take 1,000 mcg by mouth every 3 (three) days.    furosemide (LASIX) 20 MG tablet Take 1 tablet (20 mg total) by mouth daily as needed. (Patient not taking: Reported on 10/08/2021)  predniSONE (DELTASONE) 20 MG tablet Take 2 tablets (40 mg total) by mouth daily. (Patient not taking: Reported on 10/08/2021)   No facility-administered encounter medications on file as of 10/17/2022.    Allergies (verified) Patient has no known allergies.   History: Past Medical History:  Diagnosis Date   Anxiety    BPH (benign prostatic hypertrophy)    Diabetes mellitus without complication ([place])    prediabetic per pt   Fluid in knee    Hypertension    Hypothyroidism    Past Surgical  History:  Procedure Laterality Date   BACK SURGERY     COLONOSCOPY  09/22/2009   MITOMYCIN C APPLICATION Right [id number]   Procedure: MITOMYCIN C APPLICATION;  Surgeon: Abel Presto, MD;  Location: [place];  Service: Ophthalmology;  Laterality: Right;   PTERYGIUM EXCISION Right 01/10/2014   Procedure: PTERYGIUM EXCISION RIGHT EYE WITH AMNIOTIC GRAFT WITH  TISSUE GLUE MITOMYCIN C;  Surgeon: Abel Presto, MD;  Location: [place];  Service: Ophthalmology;  Laterality: Right;   Family History  Problem Relation Age of Onset   Heart disease Mother 19       MI   Cancer Father 61       lung   Thyroid disease Neg Hx    Colon polyps Neg Hx    Colon cancer Neg Hx    Esophageal cancer Neg Hx    Rectal cancer Neg Hx    Stomach cancer Neg Hx    Social History   Socioeconomic History   Marital status: Married    Spouse name: Not on file   Number of children: 0   Years of education: Not on file   Highest education level: Not on file  Occupational History   Occupation: retired  Tobacco Use   Smoking status: Never   Smokeless tobacco: Never  Vaping Use   Vaping Use: Never used  Substance and Sexual Activity   Alcohol use: Not Currently   Drug use: No   Sexual activity: Yes  Other Topics Concern   Not on file  Social History Narrative   Not on file   Social Determinants of Health   Financial Resource Strain: Low Risk  (10/17/2022)   Overall Financial Resource Strain (CARDIA)    Difficulty of Paying Living Expenses: Not hard at all  Food Insecurity: No Food Insecurity (10/17/2022)   Hunger Vital Sign    Worried About Running Out of Food in the Last Year: Never true    Ran Out of Food in the Last Year: Never true  Transportation Needs: No Transportation Needs (10/17/2022)   PRAPARE - Hydrologist (Medical): No    Lack of Transportation (Non-Medical): No  Physical Activity: Insufficiently Active (10/17/2022)   Exercise Vital Sign    Days of Exercise  per Week: 3 days    Minutes of Exercise per Session: 30 min  Stress: No Stress Concern Present (10/17/2022)   [place]    Feeling of Stress : Not at all  Social Connections: Moderately Isolated (10/17/2022)   Social Connection and Isolation Panel [NHANES]    Frequency of Communication with Friends and Family: Twice a week    Frequency of Social Gatherings with Friends and Family: Twice a week    Attends Religious Services: Never    Marine scientist or Organizations: No    Attends Archivist Meetings: Never    Marital Status: Married  Tobacco Counseling Counseling given: Not Answered   Clinical Intake:  Pre-visit preparation completed: Yes  Pain : No/denies pain Pain Score: 0-No pain     BMI - recorded: 30.34 Nutritional Status: BMI > 30  Obese Nutritional Risks: None Diabetes: No  How often do you need to have someone help you when you read instructions, pamphlets, or other written materials from your doctor or pharmacy?: 1 - Never What is the last grade level you completed in school?: HSG; 3 YEARS OF COLLEGE  Nutrition Risk Assessment:  Has the patient had any N/V/D within the last 2 months?  No  Does the patient have any non-healing wounds?  No  Has the patient had any unintentional weight loss or weight gain?  No   Diabetes:  Is the patient diabetic?  Yes  If diabetic, was a CBG obtained today?  No  Did the patient bring in their glucometer from home?  No  How often do you monitor your CBG's? ONCE A WEEK PER PATIENT.   Financial Strains and Diabetes Management:  Are you having any financial strains with the device, your supplies or your medication? No .  Does the patient want to be seen by Chronic Care Management for management of their diabetes?  Yes  Would the patient like to be referred to a Nutritionist or for Diabetic Management?  No   Diabetic Exams:  Diabetic Eye  Exam: Completed 06/08/2022 Diabetic Foot Exam: Completed AT [facility]   Interpreter Needed?: No  Information entered by :: Lisette Abu, LPN.   Activities of Daily Living    10/17/2022    3:56 PM  In your present state of health, do you have any difficulty performing the following activities:  Hearing? 1  Vision? 0  Difficulty concentrating or making decisions? 1  Walking or climbing stairs? 0  Dressing or bathing? 0  Doing errands, shopping? 0  Preparing Food and eating ? N  Using the Toilet? N  In the past six months, have you accidently leaked urine? N  Do you have problems with loss of bowel control? N  Managing your Medications? N  Managing your Finances? N  Housekeeping or managing your Housekeeping? N    Patient Care Team: Plotnikov, Evie Lacks, MD as PCP - General  Indicate any recent Medical Services you may have received from other than [facility] providers in the past year (date may be approximate).     Assessment:   This is a routine wellness examination for [place].  Hearing/Vision screen Hearing Screening - Comments:: PATIENT WEARS HEARING AIDS. Vision Screening - Comments:: Wears rx glasses - up to date with routine eye exams with Warden Fillers, MD.   Dietary issues and exercise activities discussed: Current Exercise Habits: Home exercise routine, Type of exercise: walking, Time (Minutes): 30, Frequency (Times/Week): 3, Weekly Exercise (Minutes/Week): 90, Intensity: Moderate, Exercise limited by: None identified   Goals Addressed             This Visit's Progress    MY GOAL IS TO GET MORE EXERCISE AND TO CONTINUE TO TAKE CARE OF MY WIFE.        Depression Screen    10/17/2022    3:54 PM 10/08/2021    1:15 PM 10/08/2021    1:12 PM 08/13/2020   11:10 AM 08/08/2019    9:44 AM 06/28/2018   11:38 AM 06/27/2017    9:22 AM  PHQ 2/9 Scores  PHQ - 2 Score 0 0 0 0 1 0 0  PHQ- 9 Score       3    Fall Risk    10/17/2022    3:56 PM 10/08/2021     1:15 PM 08/13/2020   11:12 AM 08/08/2019    9:44 AM 06/28/2018   11:38 AM  Fall Risk   Falls in the past year? 0 0 0 0 No  Number falls in past yr: 0 0 0 0   Injury with Fall? 0 0 0 0   Risk for fall due to : No Fall Risks  No Fall Risks    Follow up Falls prevention discussed Falls evaluation completed Falls evaluation completed;Education provided      FALL RISK PREVENTION PERTAINING TO THE HOME:  Any stairs in or around the home? Yes  HAS A CHAIR LIFT If so, are there any without handrails? No  Home free of loose throw rugs in walkways, pet beds, electrical cords, etc? Yes  Adequate lighting in your home to reduce risk of falls? Yes   ASSISTIVE DEVICES UTILIZED TO PREVENT FALLS:  Life alert? No  Use of a cane, walker or w/c? No  Grab bars in the bathroom? Yes  Shower chair or bench in shower? Yes  Elevated toilet seat or a handicapped toilet? Yes   TIMED UP AND GO: PHONE VISIT  Was the test performed? No .    Cognitive Function:    06/28/2018    9:54 AM  MMSE - Mini Mental State Exam  Orientation to time 5  Orientation to Place 5  Registration 3  Attention/ Calculation 5  Recall 3  Language- name 2 objects 2  Language- repeat 1  Language- follow 3 step command 3  Language- read & follow direction 1  Write a sentence 1  Copy design 1  Total score 30        10/17/2022    3:56 PM 08/13/2020   11:13 AM  6CIT Screen  What Year? 0 points 0 points  What month? 0 points 0 points  What time? 0 points 0 points  Count back from 20 0 points 0 points  Months in reverse 0 points 0 points  Repeat phrase 0 points 0 points  Total Score 0 points 0 points    Immunizations Immunization History  Administered Date(s) Administered   Fluad Quad(high Dose 65+) 09/13/2021   H1N1 10/30/2008   Influenza Whole 11/30/2009   Influenza, High Dose Seasonal PF 11/25/2016, 09/07/2017   Influenza, Seasonal, Injecte, Preservative Fre 08/31/2010, 10/16/2012   Influenza,inj,Quad PF,6+  Mos 11/28/2013   Influenza-Unspecified 09/28/2004, 09/04/2006, 10/15/2007, 10/16/2012, 07/22/2013, 11/04/2016, 08/24/2017   Moderna Sars-Covid-2 Vaccination 11/29/2019, 01/01/2020, 09/02/2020   Pneumococcal Conjugate-13 03/24/2015   Pneumococcal Polysaccharide-23 09/23/2010, 11/21/2010   Td 11/21/2010   Tdap 11/21/2009   Zoster Recombinat (Shingrix) 01/10/2019, 03/25/2019   Zoster, Live 10/25/2013, 11/21/2013    TDAP status: Up to date  Flu Vaccine status: Due, Education has been provided regarding the importance of this vaccine. Advised may receive this vaccine at local pharmacy or Health Dept. Aware to provide a copy of the vaccination record if obtained from local pharmacy or Health Dept. Verbalized acceptance and understanding.  Pneumococcal vaccine status: Up to date  Covid-19 vaccine status: Completed vaccines  Qualifies for Shingles Vaccine? Yes   Zostavax completed Yes   Shingrix Completed?: Yes  Screening Tests Health Maintenance  Topic Date Due   Hepatitis C Screening  Never done   INFLUENZA VACCINE  06/21/2022   COVID-19 Vaccine (6 - 2023-24 season) 07/22/2022  Medicare Annual Wellness (AWV)  10/18/2023   Pneumonia Vaccine 26+ Years old  Completed   Zoster Vaccines- Shingrix  Completed   HPV VACCINES  Aged Out   COLONOSCOPY (Pts 45-43yrs Insurance coverage will need to be confirmed)  Discontinued    Health Maintenance  Health Maintenance Due  Topic Date Due   Hepatitis C Screening  Never done   INFLUENZA VACCINE  06/21/2022   COVID-19 Vaccine (6 - 2023-24 season) 07/22/2022    Colorectal cancer screening: Type of screening: Colonoscopy. Completed 09/12/2022. Repeat every 0 years [facility]ONE AT [facility]  Lung Cancer Screening: (Low Dose CT Chest recommended if Age 68-80 years, 30 pack-year currently smoking OR have quit w/in 15years.) does not qualify.   Lung Cancer Screening Referral: NO  Additional Screening:  Hepatitis C Screening: does qualify; Completed  NO  Vision Screening: Recommended annual ophthalmology exams for early detection of glaucoma and other disorders of the eye. Is the patient up to date with their annual eye exam?  Yes  Who is the provider or what is the name of the office in which the patient attends annual eye exams? Warden Fillers, MD. If pt is not established with a provider, would they like to be referred to a provider to establish care? No .   Dental Screening: Recommended annual dental exams for proper oral hygiene  Community Resource Referral / Chronic Care Management: CRR required this visit?  No   CCM required this visit?  No      Plan:     I have personally reviewed and noted the following in the patient's chart:   Medical and social history Use of alcohol, tobacco or illicit drugs  Current medications and supplements including opioid prescriptions. Patient is not currently taking opioid prescriptions. Functional ability and status Nutritional status Physical activity Advanced directives List of other physicians Hospitalizations, surgeries, and ER visits in previous 12 months Vitals Screenings to include cognitive, depression, and falls Referrals and appointments  In addition, I have reviewed and discussed with patient certain preventive protocols, quality metrics, and best practice recommendations. A written personalized care plan for preventive services as well as general preventive health recommendations were provided to patient.     Sheral Flow, LPN   [id number]   Nurse Notes: N/A   Medical screening examination/treatment/procedure(s) were performed by non-physician practitioner and as supervising physician I was immediately available for consultation/collaboration.  I agree with above. Lew Dawes, MD

## 2023-06-12 DIAGNOSIS — H401112 Primary open-angle glaucoma, right eye, moderate stage: Secondary | ICD-10-CM | POA: Diagnosis not present

## 2023-06-12 DIAGNOSIS — H40012 Open angle with borderline findings, low risk, left eye: Secondary | ICD-10-CM | POA: Diagnosis not present

## 2023-06-12 DIAGNOSIS — H2513 Age-related nuclear cataract, bilateral: Secondary | ICD-10-CM | POA: Diagnosis not present

## 2023-06-12 DIAGNOSIS — H0102B Squamous blepharitis left eye, upper and lower eyelids: Secondary | ICD-10-CM | POA: Diagnosis not present

## 2023-06-12 DIAGNOSIS — E119 Type 2 diabetes mellitus without complications: Secondary | ICD-10-CM | POA: Diagnosis not present

## 2023-06-12 DIAGNOSIS — H11421 Conjunctival edema, right eye: Secondary | ICD-10-CM | POA: Diagnosis not present

## 2023-06-12 DIAGNOSIS — H0102A Squamous blepharitis right eye, upper and lower eyelids: Secondary | ICD-10-CM | POA: Diagnosis not present

## 2023-06-12 DIAGNOSIS — H04123 Dry eye syndrome of bilateral lacrimal glands: Secondary | ICD-10-CM | POA: Diagnosis not present

## 2023-06-12 LAB — HM DIABETES EYE EXAM

## 2023-06-27 DIAGNOSIS — R269 Unspecified abnormalities of gait and mobility: Secondary | ICD-10-CM | POA: Diagnosis not present

## 2023-06-27 DIAGNOSIS — M545 Low back pain, unspecified: Secondary | ICD-10-CM | POA: Diagnosis not present

## 2023-07-12 DIAGNOSIS — M6281 Muscle weakness (generalized): Secondary | ICD-10-CM | POA: Diagnosis not present

## 2023-07-12 DIAGNOSIS — E119 Type 2 diabetes mellitus without complications: Secondary | ICD-10-CM | POA: Diagnosis not present

## 2023-07-12 DIAGNOSIS — R262 Difficulty in walking, not elsewhere classified: Secondary | ICD-10-CM | POA: Diagnosis not present

## 2023-07-12 DIAGNOSIS — I10 Essential (primary) hypertension: Secondary | ICD-10-CM | POA: Diagnosis not present

## 2023-07-17 DIAGNOSIS — R262 Difficulty in walking, not elsewhere classified: Secondary | ICD-10-CM | POA: Diagnosis not present

## 2023-07-17 DIAGNOSIS — M6281 Muscle weakness (generalized): Secondary | ICD-10-CM | POA: Diagnosis not present

## 2023-07-17 DIAGNOSIS — I10 Essential (primary) hypertension: Secondary | ICD-10-CM | POA: Diagnosis not present

## 2023-07-17 DIAGNOSIS — E119 Type 2 diabetes mellitus without complications: Secondary | ICD-10-CM | POA: Diagnosis not present

## 2023-07-18 DIAGNOSIS — I10 Essential (primary) hypertension: Secondary | ICD-10-CM | POA: Diagnosis not present

## 2023-07-18 DIAGNOSIS — E538 Deficiency of other specified B group vitamins: Secondary | ICD-10-CM | POA: Diagnosis not present

## 2023-07-18 DIAGNOSIS — E559 Vitamin D deficiency, unspecified: Secondary | ICD-10-CM | POA: Diagnosis not present

## 2023-07-18 DIAGNOSIS — E1142 Type 2 diabetes mellitus with diabetic polyneuropathy: Secondary | ICD-10-CM | POA: Diagnosis not present

## 2023-07-18 DIAGNOSIS — G629 Polyneuropathy, unspecified: Secondary | ICD-10-CM | POA: Diagnosis not present

## 2023-07-18 DIAGNOSIS — G4730 Sleep apnea, unspecified: Secondary | ICD-10-CM | POA: Diagnosis not present

## 2023-07-20 DIAGNOSIS — I10 Essential (primary) hypertension: Secondary | ICD-10-CM | POA: Diagnosis not present

## 2023-07-20 DIAGNOSIS — E119 Type 2 diabetes mellitus without complications: Secondary | ICD-10-CM | POA: Diagnosis not present

## 2023-07-20 DIAGNOSIS — R262 Difficulty in walking, not elsewhere classified: Secondary | ICD-10-CM | POA: Diagnosis not present

## 2023-07-20 DIAGNOSIS — M6281 Muscle weakness (generalized): Secondary | ICD-10-CM | POA: Diagnosis not present

## 2023-07-27 DIAGNOSIS — R262 Difficulty in walking, not elsewhere classified: Secondary | ICD-10-CM | POA: Diagnosis not present

## 2023-07-27 DIAGNOSIS — E119 Type 2 diabetes mellitus without complications: Secondary | ICD-10-CM | POA: Diagnosis not present

## 2023-07-27 DIAGNOSIS — M6281 Muscle weakness (generalized): Secondary | ICD-10-CM | POA: Diagnosis not present

## 2023-07-27 DIAGNOSIS — I10 Essential (primary) hypertension: Secondary | ICD-10-CM | POA: Diagnosis not present

## 2023-08-02 DIAGNOSIS — G4733 Obstructive sleep apnea (adult) (pediatric): Secondary | ICD-10-CM | POA: Diagnosis not present

## 2023-09-27 ENCOUNTER — Other Ambulatory Visit: Payer: Self-pay | Admitting: Student in an Organized Health Care Education/Training Program

## 2023-09-27 DIAGNOSIS — R269 Unspecified abnormalities of gait and mobility: Secondary | ICD-10-CM

## 2023-09-27 DIAGNOSIS — M545 Low back pain, unspecified: Secondary | ICD-10-CM

## 2023-09-27 DIAGNOSIS — M47816 Spondylosis without myelopathy or radiculopathy, lumbar region: Secondary | ICD-10-CM | POA: Diagnosis not present

## 2023-09-28 ENCOUNTER — Encounter: Payer: Self-pay | Admitting: Student in an Organized Health Care Education/Training Program

## 2023-10-09 DIAGNOSIS — M5451 Vertebrogenic low back pain: Secondary | ICD-10-CM | POA: Diagnosis not present

## 2023-10-09 DIAGNOSIS — M79605 Pain in left leg: Secondary | ICD-10-CM | POA: Diagnosis not present

## 2023-10-09 DIAGNOSIS — R2689 Other abnormalities of gait and mobility: Secondary | ICD-10-CM | POA: Diagnosis not present

## 2023-10-16 ENCOUNTER — Ambulatory Visit
Admission: RE | Admit: 2023-10-16 | Discharge: 2023-10-16 | Disposition: A | Discharge status: Home / Self Care | Payer: Medicare Other | Source: Ambulatory Visit | Attending: Student in an Organized Health Care Education/Training Program | Admitting: Student in an Organized Health Care Education/Training Program

## 2023-10-16 DIAGNOSIS — R269 Unspecified abnormalities of gait and mobility: Secondary | ICD-10-CM

## 2023-10-16 DIAGNOSIS — M5126 Other intervertebral disc displacement, lumbar region: Secondary | ICD-10-CM | POA: Diagnosis not present

## 2023-10-16 DIAGNOSIS — M545 Low back pain, unspecified: Secondary | ICD-10-CM

## 2023-10-24 DIAGNOSIS — R2689 Other abnormalities of gait and mobility: Secondary | ICD-10-CM | POA: Diagnosis not present

## 2023-10-24 DIAGNOSIS — M5451 Vertebrogenic low back pain: Secondary | ICD-10-CM | POA: Diagnosis not present

## 2023-10-24 DIAGNOSIS — M79605 Pain in left leg: Secondary | ICD-10-CM | POA: Diagnosis not present

## 2023-10-24 DIAGNOSIS — M48062 Spinal stenosis, lumbar region with neurogenic claudication: Secondary | ICD-10-CM | POA: Diagnosis not present

## 2023-10-27 DIAGNOSIS — E1142 Type 2 diabetes mellitus with diabetic polyneuropathy: Secondary | ICD-10-CM | POA: Diagnosis not present

## 2023-10-27 DIAGNOSIS — Z0000 Encounter for general adult medical examination without abnormal findings: Secondary | ICD-10-CM | POA: Diagnosis not present

## 2023-10-27 DIAGNOSIS — D649 Anemia, unspecified: Secondary | ICD-10-CM | POA: Diagnosis not present

## 2023-10-27 DIAGNOSIS — M4807 Spinal stenosis, lumbosacral region: Secondary | ICD-10-CM | POA: Diagnosis not present

## 2023-11-03 DIAGNOSIS — D649 Anemia, unspecified: Secondary | ICD-10-CM | POA: Diagnosis not present

## 2023-11-03 DIAGNOSIS — Z23 Encounter for immunization: Secondary | ICD-10-CM | POA: Diagnosis not present

## 2023-11-03 DIAGNOSIS — E538 Deficiency of other specified B group vitamins: Secondary | ICD-10-CM | POA: Diagnosis not present

## 2023-11-24 DIAGNOSIS — R260 Ataxic gait: Secondary | ICD-10-CM | POA: Diagnosis not present

## 2023-11-24 DIAGNOSIS — M48062 Spinal stenosis, lumbar region with neurogenic claudication: Secondary | ICD-10-CM | POA: Diagnosis not present

## 2023-11-30 DIAGNOSIS — M48062 Spinal stenosis, lumbar region with neurogenic claudication: Secondary | ICD-10-CM | POA: Diagnosis not present

## 2023-11-30 DIAGNOSIS — R260 Ataxic gait: Secondary | ICD-10-CM | POA: Diagnosis not present

## 2023-12-01 DIAGNOSIS — M4726 Other spondylosis with radiculopathy, lumbar region: Secondary | ICD-10-CM | POA: Diagnosis not present

## 2023-12-01 DIAGNOSIS — M5432 Sciatica, left side: Secondary | ICD-10-CM | POA: Diagnosis not present

## 2023-12-01 DIAGNOSIS — M48062 Spinal stenosis, lumbar region with neurogenic claudication: Secondary | ICD-10-CM | POA: Diagnosis not present

## 2023-12-01 DIAGNOSIS — M963 Postlaminectomy kyphosis: Secondary | ICD-10-CM | POA: Diagnosis not present

## 2023-12-06 ENCOUNTER — Emergency Department: Payer: Medicare Other

## 2023-12-06 ENCOUNTER — Emergency Department
Admission: EM | Admit: 2023-12-06 | Discharge: 2023-12-06 | Disposition: A | Discharge status: Home / Self Care | Payer: Medicare Other | Attending: Emergency Medicine | Admitting: Emergency Medicine

## 2023-12-06 ENCOUNTER — Other Ambulatory Visit: Payer: Self-pay

## 2023-12-06 DIAGNOSIS — M79604 Pain in right leg: Secondary | ICD-10-CM

## 2023-12-06 DIAGNOSIS — M79661 Pain in right lower leg: Secondary | ICD-10-CM | POA: Insufficient documentation

## 2023-12-06 LAB — CBC WITH DIFFERENTIAL/PLATELET
Abs Immature Granulocytes: 0 10*3/uL (ref 0.00–0.07)
Basophils Absolute: 0 10*3/uL (ref 0.0–0.1)
Basophils Relative: 1 %
Eosinophils Absolute: 0.2 10*3/uL (ref 0.0–0.5)
Eosinophils Relative: 3 %
HCT: 38.5 % — ABNORMAL LOW (ref 39.0–52.0)
Hemoglobin: 12.4 g/dL — ABNORMAL LOW (ref 13.0–17.0)
Immature Granulocytes: 0 %
Lymphocytes Relative: 31 %
Lymphs Abs: 1.4 10*3/uL (ref 0.7–4.0)
MCH: 29 pg (ref 26.0–34.0)
MCHC: 32.2 g/dL (ref 30.0–36.0)
MCV: 90.2 fL (ref 80.0–100.0)
Monocytes Absolute: 0.6 10*3/uL (ref 0.1–1.0)
Monocytes Relative: 14 %
Neutro Abs: 2.3 10*3/uL (ref 1.7–7.7)
Neutrophils Relative %: 51 %
Platelets: 170 10*3/uL (ref 150–400)
RBC: 4.27 MIL/uL (ref 4.22–5.81)
RDW: 14.3 % (ref 11.5–15.5)
WBC: 4.5 10*3/uL (ref 4.0–10.5)
nRBC: 0 % (ref 0.0–0.2)

## 2023-12-06 LAB — BASIC METABOLIC PANEL
Anion gap: 8 (ref 5–15)
BUN: 17 mg/dL (ref 8–23)
CO2: 25 mmol/L (ref 22–32)
Calcium: 9.1 mg/dL (ref 8.9–10.3)
Chloride: 106 mmol/L (ref 98–111)
Creatinine, Ser: 0.97 mg/dL (ref 0.61–1.24)
GFR, Estimated: >60 mL/min (ref >60)
Glucose, Bld: 103 mg/dL — ABNORMAL HIGH (ref 70–99)
Potassium: 4.1 mmol/L (ref 3.5–5.1)
Sodium: 139 mmol/L (ref 135–145)

## 2023-12-06 NOTE — ED Provider Notes (Signed)
 [facility] Provider Note    Event Date/Time   First MD Initiated Contact with Patient 12/06/23 1709     (approximate)   History   Leg Pain   HPI  Blake Zamora is a 83 y.o. male who presented to the emergency department today because of concerns for right lower leg pain.  The symptoms started 2 days ago.  They started after he had worn some compression stocking.  Located on the medial aspect of his right shin.  It did radiate down into his ankle.  He noticed some swelling to his ankle.  Denies any trauma.  Denies any unusual exertion.  Denies similar leg pain in the past.     Physical Exam   Triage Vital Signs: ED Triage Vitals  Encounter Vitals Group     BP 12/06/23 1227 (!) 173/91     Systolic BP Percentile --      Diastolic BP Percentile --      Pulse Rate 12/06/23 1227 83     Resp 12/06/23 1227 16     Temp 12/06/23 1227 98.4 F (36.9 C)     Temp Source 12/06/23 1227 Oral     SpO2 12/06/23 1227 98 %     Weight 12/06/23 1228 235 lb (106.6 kg)     Height 12/06/23 1228 6' (1.829 m)     Head Circumference --      Peak Flow --      Pain Score 12/06/23 1228 3     Pain Loc --      Pain Education --      Exclude from Growth Chart --     Most recent vital signs: Vitals:   12/06/23 1227  BP: (!) 173/91  Pulse: 83  Resp: 16  Temp: 98.4 F (36.9 C)  SpO2: 98%   General: Awake, alert, oriented. CV:  Good peripheral perfusion. Regular rate and rhythm. Resp:  Normal effort. Lungs clear. Abd:  No distention.  Ext:  Right lower extremity without any swelling. No erythema or warmth. No deformity.  ED Results / Procedures / Treatments   Labs (all labs ordered are listed, but only abnormal results are displayed) Labs Reviewed  BASIC METABOLIC PANEL - Abnormal; Notable for the following components:      Result Value   Glucose, Bld 103 (*)    All other components within normal limits  CBC WITH DIFFERENTIAL/PLATELET - Abnormal; Notable for  the following components:   Hemoglobin 12.4 (*)    HCT 38.5 (*)    All other components within normal limits     EKG  None   RADIOLOGY I independently interpreted and visualized the right lower extremity US . My interpretation: No clot Radiology interpretation:  IMPRESSION:  Negative.    PROCEDURES:  Critical Care performed: No    MEDICATIONS ORDERED IN ED: Medications - No data to display   IMPRESSION / MDM / ASSESSMENT AND PLAN / ED COURSE  I reviewed the triage vital signs and the nursing notes.                              Differential diagnosis includes, but is not limited to, DVT, cellulitis, contusion  Patient's presentation is most consistent with acute presentation with potential threat to life or bodily function.   Patient presents to the emergency department today because of concerns for right lower leg pain.  Ultrasound was performed in triage which did  not show any DVT.  On exam and no discoloration, no swelling to that leg.  No concerning physical exam findings.  This time somewhat unclear etiology of the pain however I do think it is reasonable for patient to be discharged.      FINAL CLINICAL IMPRESSION(S) / ED DIAGNOSES   Final diagnoses:  Right leg pain     Note:  This document was prepared using Dragon voice recognition software and may include unintentional dictation errors.    Marylynn Soho, MD 12/06/23 [phone]

## 2023-12-06 NOTE — ED Provider Triage Note (Signed)
Emergency Medicine Provider Triage Evaluation Note  Blake Zamora , a 83 y.o. male  was evaluated in triage.  Pt complains of right leg pain x2 days. Reports anterior aspect of shin towards his toe, hurts with pressure or touch. Able to walk, no weakness. No new tingling/numbness, no back pain. No fevers or chills. No history of DVT/PE  Review of Systems  Positive: Right leg pain Negative: fevers  Physical Exam  There were no vitals taken for this visit. Gen:   Awake, no distress   Resp:  Normal effort \ MSK:   Moves extremities without difficulty  Other:    Medical Decision Making  Medically screening exam initiated at 12:23 PM.  Appropriate orders placed.  Blake Zamora was informed that the remainder of the evaluation will be completed by another provider, this initial triage assessment does not replace that evaluation, and the importance of remaining in the ED until their evaluation is complete.     Jackelyn Hoehn, PA-C 12/06/23 1227

## 2023-12-06 NOTE — ED Triage Notes (Signed)
 Pt states that he started having right lower leg pain Monday and its cont to hurt, pt points to the inside of his shin medially and reports that the pain radiates down to the foot, pt has some swelling noted with some redness in comparison to the left leg, reports hx of neuropathy, denies injury

## 2023-12-06 NOTE — Discharge Instructions (Signed)
 Please seek medical attention for any high fevers, chest pain, shortness of breath, change in behavior, persistent vomiting, bloody stool or any other new or concerning symptoms.

## 2023-12-09 ENCOUNTER — Other Ambulatory Visit: Payer: Self-pay

## 2023-12-13 ENCOUNTER — Ambulatory Visit (HOSPITAL_COMMUNITY): Payer: Medicare Other

## 2023-12-13 DIAGNOSIS — R600 Localized edema: Secondary | ICD-10-CM | POA: Diagnosis not present

## 2023-12-13 DIAGNOSIS — I10 Essential (primary) hypertension: Secondary | ICD-10-CM | POA: Diagnosis not present

## 2023-12-13 DIAGNOSIS — I872 Venous insufficiency (chronic) (peripheral): Secondary | ICD-10-CM | POA: Diagnosis not present

## 2023-12-21 DIAGNOSIS — M79604 Pain in right leg: Secondary | ICD-10-CM | POA: Diagnosis not present

## 2023-12-21 DIAGNOSIS — M48062 Spinal stenosis, lumbar region with neurogenic claudication: Secondary | ICD-10-CM | POA: Diagnosis not present

## 2023-12-21 DIAGNOSIS — E1142 Type 2 diabetes mellitus with diabetic polyneuropathy: Secondary | ICD-10-CM | POA: Diagnosis not present

## 2023-12-21 DIAGNOSIS — I10 Essential (primary) hypertension: Secondary | ICD-10-CM | POA: Diagnosis not present

## 2023-12-21 DIAGNOSIS — M963 Postlaminectomy kyphosis: Secondary | ICD-10-CM | POA: Diagnosis not present

## 2023-12-28 ENCOUNTER — Other Ambulatory Visit: Payer: Self-pay | Admitting: *Deleted

## 2023-12-28 DIAGNOSIS — M79606 Pain in leg, unspecified: Secondary | ICD-10-CM

## 2024-01-01 NOTE — Progress Notes (Deleted)
 Patient name: Blake Zamora MRN: [id number] DOB: 1941-09-14 Sex: male  REASON FOR CONSULT: Circulation issues right leg  HPI: Blake Zamora is a 83 y.o. male, with history diabetes and hypertension the presents for evaluation of circulation issues to the right leg.  Previously seen in the ED on 12/06/2023 at [facility] with right leg pain.  DVT study was negative.  Referred to vascular.  ABIs ordered today.  Past Medical History:  Diagnosis Date   Anxiety    BPH (benign prostatic hypertrophy)    Diabetes mellitus without complication (HCC)    prediabetic per pt   Fluid in knee    Hypertension    Hypothyroidism     Past Surgical History:  Procedure Laterality Date   BACK SURGERY     COLONOSCOPY  09/22/2009   MITOMYCIN C APPLICATION Right 01/10/2014   Procedure: MITOMYCIN C APPLICATION;  Surgeon: Trish Fountain, MD;  Location: [facility] OR;  Service: Ophthalmology;  Laterality: Right;   PTERYGIUM EXCISION Right 01/10/2014   Procedure: PTERYGIUM EXCISION RIGHT EYE WITH AMNIOTIC GRAFT WITH  TISSUE GLUE MITOMYCIN C;  Surgeon: Trish Fountain, MD;  Location: [facility] OR;  Service: Ophthalmology;  Laterality: Right;    Family History  Problem Relation Age of Onset   Heart disease Mother 31       MI   Cancer Father 46       lung   Thyroid disease Neg Hx    Colon polyps Neg Hx    Colon cancer Neg Hx    Esophageal cancer Neg Hx    Rectal cancer Neg Hx    Stomach cancer Neg Hx     SOCIAL HISTORY: Social History   Socioeconomic History   Marital status: Married    Spouse name: Not on file   Number of children: 0   Years of education: Not on file   Highest education level: Not on file  Occupational History   Occupation: retired  Tobacco Use   Smoking status: Never   Smokeless tobacco: Never  Vaping Use   Vaping status: Never Used  Substance and Sexual Activity   Alcohol use: Not Currently   Drug use: No   Sexual activity: Yes  Other Topics Concern   Not on file  Social  History Narrative   Not on file   Social Drivers of Health   Financial Resource Strain: Low Risk  (10/17/2022)   Overall Financial Resource Strain (CARDIA)    Difficulty of Paying Living Expenses: Not hard at all  Food Insecurity: No Food Insecurity (10/17/2022)   Hunger Vital Sign    Worried About Running Out of Food in the Last Year: Never true    Ran Out of Food in the Last Year: Never true  Transportation Needs: No Transportation Needs (10/17/2022)   PRAPARE - Administrator, Civil Service (Medical): No    Lack of Transportation (Non-Medical): No  Physical Activity: Insufficiently Active (10/17/2022)   Exercise Vital Sign    Days of Exercise per Week: 3 days    Minutes of Exercise per Session: 30 min  Stress: No Stress Concern Present (10/17/2022)   [employer] of Occupational Health - Occupational Stress Questionnaire    Feeling of Stress : Not at all  Social Connections: Moderately Isolated (10/17/2022)   Social Connection and Isolation Panel [NHANES]    Frequency of Communication with Friends and Family: Twice a week    Frequency of Social Gatherings with Friends and Family: Twice  a week    Attends Religious Services: Never    Active Member of Clubs or Organizations: No    Attends Banker Meetings: Never    Marital Status: Married  Catering manager Violence: Not At Risk (10/17/2022)   Humiliation, Afraid, Rape, and Kick questionnaire    Fear of Current or Ex-Partner: No    Emotionally Abused: No    Physically Abused: No    Sexually Abused: No    No Known Allergies  Current Outpatient Medications  Medication Sig Dispense Refill   amLODipine (NORVASC) 2.5 MG tablet Take 2.5 mg by mouth daily.     aspirin 81 MG EC tablet Take 81 mg by mouth daily.       benzonatate (TESSALON) 100 MG capsule Take 1 capsule (100 mg total) by mouth 3 (three) times daily as needed for cough. 30 capsule 0   Cholecalciferol (VITAMIN D3) 1000 UNITS tablet Take  1,000 Units by mouth daily.       furosemide (LASIX) 20 MG tablet Take 1 tablet (20 mg total) by mouth daily as needed. (Patient not taking: Reported on 10/08/2021) 30 tablet 3   gabapentin (NEURONTIN) 300 MG capsule Take 300 mg by mouth every other day.     levothyroxine (SYNTHROID, LEVOTHROID) 112 MCG tablet TAKE 1 TABLET BY MOUTH DAILY 90 tablet 3   metFORMIN (GLUCOPHAGE) 500 MG tablet Take 500 mg by mouth every other day.     predniSONE (DELTASONE) 20 MG tablet Take 2 tablets (40 mg total) by mouth daily. (Patient not taking: Reported on 10/08/2021) 10 tablet 0   tamsulosin (FLOMAX) 0.4 MG CAPS capsule Take 0.4 mg by mouth daily.     triamcinolone cream (KENALOG) 0.1 % Apply 1 application topically 3 (three) times daily as needed. Rash 80 g 3   vitamin B-12 (CYANOCOBALAMIN) 1000 MCG tablet Take 1,000 mcg by mouth every 3 (three) days.      No current facility-administered medications for this visit.    REVIEW OF SYSTEMS:  [X]  denotes positive finding, [ ]  denotes negative finding Cardiac  Comments:  Chest pain or chest pressure: ***   Shortness of breath upon exertion:    Short of breath when lying flat:    Irregular heart rhythm:        Vascular    Pain in calf, thigh, or hip brought on by ambulation:    Pain in feet at night that wakes you up from your sleep:     Blood clot in your veins:    Leg swelling:         Pulmonary    Oxygen at home:    Productive cough:     Wheezing:         Neurologic    Sudden weakness in arms or legs:     Sudden numbness in arms or legs:     Sudden onset of difficulty speaking or slurred speech:    Temporary loss of vision in one eye:     Problems with dizziness:         Gastrointestinal    Blood in stool:     Vomited blood:         Genitourinary    Burning when urinating:     Blood in urine:        Psychiatric    Major depression:         Hematologic    Bleeding problems:    Problems with blood clotting too easily:  Skin     Rashes or ulcers:        Constitutional    Fever or chills:      PHYSICAL EXAM: There were no vitals filed for this visit.  GENERAL: The patient is a well-nourished male, in no acute distress. The vital signs are documented above. CARDIAC: There is a regular rate and rhythm.  VASCULAR: *** PULMONARY: There is good air exchange bilaterally without wheezing or rales. ABDOMEN: Soft and non-tender with normal pitched bowel sounds.  MUSCULOSKELETAL: There are no major deformities or cyanosis. NEUROLOGIC: No focal weakness or paresthesias are detected. SKIN: There are no ulcers or rashes noted. PSYCHIATRIC: The patient has a normal affect.  DATA:   ***  Assessment/Plan:  83 y.o. male, with history diabetes and hypertension the presents for evaluation of circulation issues to the right leg.  Previously seen in the ED on 12/06/2023 at [facility] with right leg pain.  DVT study was negative.  Referred to vascular.  ABIs ordered today.   Cephus Shelling, MD [facility] [place] Office: [phone]

## 2024-01-02 ENCOUNTER — Ambulatory Visit (HOSPITAL_COMMUNITY): Payer: Medicare Other

## 2024-01-02 ENCOUNTER — Encounter: Payer: Medicare Other | Admitting: Vascular Surgery

## 2024-01-17 DIAGNOSIS — K080 Exfoliation of teeth due to systemic causes: Secondary | ICD-10-CM | POA: Diagnosis not present

## 2024-01-31 NOTE — Progress Notes (Deleted)
 Patient ID: Blake Zamora, male   DOB: 15-Jan-1941, 83 y.o.   MRN: [id number]  Reason for Consult: No chief complaint on file.   Referred by Jackelyn Poling, DO  Subjective:     HPI  Blake Zamora is a 83 y.o. male with history diabetes and hypertension the presents for evaluation of circulation issues to the right leg.  Previously seen in the ED on 12/06/2023 at [facility] with right leg pain.  DVT study was negative.  Referred to vascular. ***  Past Medical History:  Diagnosis Date   Anxiety    BPH (benign prostatic hypertrophy)    Diabetes mellitus without complication (HCC)    prediabetic per pt   Fluid in knee    Hypertension    Hypothyroidism    Family History  Problem Relation Age of Onset   Heart disease Mother 91       MI   Cancer Father 59       lung   Thyroid disease Neg Hx    Colon polyps Neg Hx    Colon cancer Neg Hx    Esophageal cancer Neg Hx    Rectal cancer Neg Hx    Stomach cancer Neg Hx    Past Surgical History:  Procedure Laterality Date   BACK SURGERY     COLONOSCOPY  09/22/2009   MITOMYCIN C APPLICATION Right 01/10/2014   Procedure: MITOMYCIN C APPLICATION;  Surgeon: Trish Fountain, MD;  Location: [facility] OR;  Service: Ophthalmology;  Laterality: Right;   PTERYGIUM EXCISION Right 01/10/2014   Procedure: PTERYGIUM EXCISION RIGHT EYE WITH AMNIOTIC GRAFT WITH  TISSUE GLUE MITOMYCIN C;  Surgeon: Trish Fountain, MD;  Location: [facility] OR;  Service: Ophthalmology;  Laterality: Right;    Short Social History:  Social History   Tobacco Use   Smoking status: Never   Smokeless tobacco: Never  Substance Use Topics   Alcohol use: Not Currently    No Known Allergies  Current Outpatient Medications  Medication Sig Dispense Refill   amLODipine (NORVASC) 2.5 MG tablet Take 2.5 mg by mouth daily.     aspirin 81 MG EC tablet Take 81 mg by mouth daily.       benzonatate (TESSALON) 100 MG capsule Take 1 capsule (100 mg total) by mouth 3 (three) times daily as  needed for cough. 30 capsule 0   Cholecalciferol (VITAMIN D3) 1000 UNITS tablet Take 1,000 Units by mouth daily.       furosemide (LASIX) 20 MG tablet Take 1 tablet (20 mg total) by mouth daily as needed. (Patient not taking: Reported on 10/08/2021) 30 tablet 3   gabapentin (NEURONTIN) 300 MG capsule Take 300 mg by mouth every other day.     levothyroxine (SYNTHROID, LEVOTHROID) 112 MCG tablet TAKE 1 TABLET BY MOUTH DAILY 90 tablet 3   metFORMIN (GLUCOPHAGE) 500 MG tablet Take 500 mg by mouth every other day.     predniSONE (DELTASONE) 20 MG tablet Take 2 tablets (40 mg total) by mouth daily. (Patient not taking: Reported on 10/08/2021) 10 tablet 0   tamsulosin (FLOMAX) 0.4 MG CAPS capsule Take 0.4 mg by mouth daily.     triamcinolone cream (KENALOG) 0.1 % Apply 1 application topically 3 (three) times daily as needed. Rash 80 g 3   vitamin B-12 (CYANOCOBALAMIN) 1000 MCG tablet Take 1,000 mcg by mouth every 3 (three) days.      No current facility-administered medications for this visit.    REVIEW OF SYSTEMS  Positive  for ***  All other systems were reviewed and are negative     Objective:  Objective   There were no vitals filed for this visit. There is no height or weight on file to calculate BMI.  Physical Exam General: no acute distress Cardiac: hemodynamically stable Pulm: normal work of breathing Abdomen: non-tender, no pulsatile mass*** Neuro: alert, no focal deficit Extremities: no edema, cyanosis or wounds*** Vascular:   Right: ***  Left: ***   Data: ABI ***  RLE DVT study Negative  BMP reviewed, Cr 0.97     Assessment/Plan:     Blake Zamora is a 83 y.o. male ***    Recommendations to optimize cardiovascular risk: Abstinence from all tobacco products. Blood glucose control with goal A1c < 7%. Blood pressure control with goal blood pressure < 140/90 mmHg. Lipid reduction therapy with goal LDL-C <100 mg/dL  Aspirin 81mg  PO QD.  Atorvastatin 40-80mg   PO QD (or other "high intensity" statin therapy).     Daria Pastures MD [facility]

## 2024-02-02 ENCOUNTER — Encounter: Payer: Medicare Other | Admitting: Vascular Surgery

## 2024-02-02 ENCOUNTER — Ambulatory Visit (HOSPITAL_COMMUNITY): Payer: Medicare Other

## 2024-02-06 DIAGNOSIS — M48062 Spinal stenosis, lumbar region with neurogenic claudication: Secondary | ICD-10-CM | POA: Diagnosis not present

## 2024-02-27 DIAGNOSIS — K080 Exfoliation of teeth due to systemic causes: Secondary | ICD-10-CM | POA: Diagnosis not present

## 2024-03-18 DIAGNOSIS — M963 Postlaminectomy kyphosis: Secondary | ICD-10-CM | POA: Diagnosis not present

## 2024-03-18 DIAGNOSIS — M48062 Spinal stenosis, lumbar region with neurogenic claudication: Secondary | ICD-10-CM | POA: Diagnosis not present

## 2024-03-28 NOTE — Progress Notes (Deleted)
 Patient ID: Blake Zamora, male   DOB: Aug 19, 1941, 83 y.o.   MRN: [id number]  Reason for Consult: No chief complaint on file.   Referred by Mordechai April, DO  Subjective:     HPI  Blake Zamora is a 83 y.o. male who follows at the [place] presenting for evaluation of right leg swelling.  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  He denies pain with ambulation, nocturnal rest pain or nonhealing wounds.  He is a never smoker.  Past Medical History:  Diagnosis Date   Anxiety    BPH (benign prostatic hypertrophy)    Diabetes mellitus without complication (HCC)    prediabetic per pt   Fluid in knee    Hypertension    Hypothyroidism    Family History  Problem Relation Age of Onset   Heart disease Mother 14       MI   Cancer Father 35       lung   Thyroid  disease Neg Hx    Colon polyps Neg Hx    Colon cancer Neg Hx    Esophageal cancer Neg Hx    Rectal cancer Neg Hx    Stomach cancer Neg Hx    Past Surgical History:  Procedure Laterality Date   BACK SURGERY     COLONOSCOPY  09/22/2009   MITOMYCIN  C APPLICATION Right 01/10/2014   Procedure: MITOMYCIN  C APPLICATION;  Surgeon: Susen Epstein, MD;  Location: [facility] OR;  Service: Ophthalmology;  Laterality: Right;   PTERYGIUM EXCISION Right 01/10/2014   Procedure: PTERYGIUM EXCISION RIGHT EYE WITH AMNIOTIC GRAFT WITH  TISSUE GLUE MITOMYCIN  C;  Surgeon: Susen Epstein, MD;  Location: [facility] OR;  Service: Ophthalmology;  Laterality: Right;    Short Social History:  Social History   Tobacco Use   Smoking status: Never   Smokeless tobacco: Never  Substance Use Topics   Alcohol use: Not Currently    No Known Allergies  Current Outpatient Medications  Medication Sig Dispense Refill   amLODipine (NORVASC) 2.5 MG tablet Take 2.5 mg by mouth daily.     aspirin 81 MG EC tablet Take 81 mg by mouth daily.       benzonatate  (TESSALON ) 100 MG capsule Take 1 capsule (100 mg  total) by mouth 3 (three) times daily as needed for cough. 30 capsule 0   Cholecalciferol (VITAMIN D3) 1000 UNITS tablet Take 1,000 Units by mouth daily.       furosemide  (LASIX ) 20 MG tablet Take 1 tablet (20 mg total) by mouth daily as needed. (Patient not taking: Reported on 10/08/2021) 30 tablet 3   gabapentin (NEURONTIN) 300 MG capsule Take 300 mg by mouth every other day.     levothyroxine (SYNTHROID, LEVOTHROID) 112 MCG tablet TAKE 1 TABLET BY MOUTH DAILY 90 tablet 3   metFORMIN (GLUCOPHAGE) 500 MG tablet Take 500 mg by mouth every other day.     predniSONE  (DELTASONE ) 20 MG tablet Take 2 tablets (40 mg total) by mouth daily. (Patient not taking: Reported on 10/08/2021) 10 tablet 0   tamsulosin (FLOMAX) 0.4 MG CAPS capsule Take 0.4 mg by mouth daily.     triamcinolone  cream (KENALOG ) 0.1 % Apply 1 application topically 3 (three) times daily as needed. Rash 80 g 3   vitamin B-12 (CYANOCOBALAMIN ) 1000 MCG tablet Take 1,000 mcg by mouth every 3 (three) days.      No current facility-administered medications for this visit.  REVIEW OF SYSTEMS All other systems were reviewed and are negative     Objective:  Objective   There were no vitals filed for this visit. There is no height or weight on file to calculate BMI.  Physical Exam General: no acute distress Cardiac: hemodynamically stable Pulm: normal work of breathing Abdomen: non-tender, no pulsatile mass*** Neuro: alert, no focal deficit Extremities: *** Vascular:   Right: ***  Left: ***   Data: ABI ***  Right leg DVT study Negative  BMP reviewed, creatinine 0.97     Assessment/Plan:     Blake Zamora is a 83 y.o. male ***    Recommendations to optimize cardiovascular risk: Abstinence from all tobacco products. Blood glucose control with goal A1c < 7%. Blood pressure control with goal blood pressure < 140/90 mmHg. Lipid reduction therapy with goal LDL-C <100 mg/dL  Aspirin 81mg  PO QD.  Atorvastatin  40-80mg  PO QD (or other "high intensity" statin therapy).     Philipp Brawn MD [facility]

## 2024-03-29 ENCOUNTER — Ambulatory Visit (HOSPITAL_COMMUNITY)

## 2024-03-29 ENCOUNTER — Ambulatory Visit: Admitting: Vascular Surgery

## 2024-04-03 DIAGNOSIS — I872 Venous insufficiency (chronic) (peripheral): Secondary | ICD-10-CM | POA: Diagnosis not present

## 2024-04-03 DIAGNOSIS — L2084 Intrinsic (allergic) eczema: Secondary | ICD-10-CM | POA: Diagnosis not present

## 2024-04-17 ENCOUNTER — Other Ambulatory Visit: Payer: Self-pay

## 2024-04-17 DIAGNOSIS — M79606 Pain in leg, unspecified: Secondary | ICD-10-CM

## 2024-05-02 NOTE — Progress Notes (Signed)
 Patient ID: Blake Zamora, male   DOB: [birth year], 83 y.o.   MRN: [id number]  Reason for Consult: New Patient (Initial Visit)   Referred by Mordechai April, DO  Subjective:     HPI Blake Zamora is a 83 y.o. male presenting for RLE pain.  He has a history of neuropathy and bilateral lateral lower extremity edema.  He denies pain with ambulation, rest pain or wounds.  His main concern today is that he has numbness of both feet up to mid shins bilaterally.  Left is a little worse than right.  He has worsening numbness when he is walking for a long period such as through a store and almost starts to develop some left foot drop and feels unbalanced.  He denies any pain during any of these episodes.  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  He sees a spine subspecialist and is also undergoing regular chiropractic care. He is a never smoker.  Past Medical History:  Diagnosis Date   Anxiety    BPH (benign prostatic hypertrophy)    Diabetes mellitus without complication (HCC)    prediabetic per pt   Fluid in knee    Hyperlipidemia    Hypertension    Hypothyroidism    Family History  Problem Relation Age of Onset   Heart disease Mother 2       MI   Cancer Father 26       lung   Thyroid  disease Neg Hx    Colon polyps Neg Hx    Colon cancer Neg Hx    Esophageal cancer Neg Hx    Rectal cancer Neg Hx    Stomach cancer Neg Hx    Past Surgical History:  Procedure Laterality Date   BACK SURGERY     COLONOSCOPY  09/22/2009   MITOMYCIN  C APPLICATION Right 01/10/2014   Procedure: MITOMYCIN  C APPLICATION;  Surgeon: Susen Epstein, MD;  Location: [facility];  Service: Ophthalmology;  Laterality: Right;   PTERYGIUM EXCISION Right 01/10/2014   Procedure: PTERYGIUM EXCISION RIGHT EYE WITH AMNIOTIC GRAFT WITH  TISSUE GLUE MITOMYCIN  C;  Surgeon: Susen Epstein, MD;  Location: [facility] OR;  Service: Ophthalmology;  Laterality: Right;    Short  Social History:  Social History   Tobacco Use   Smoking status: Never   Smokeless tobacco: Never  Substance Use Topics   Alcohol use: Not Currently    No Known Allergies  Current Outpatient Medications  Medication Sig Dispense Refill   aspirin 81 MG EC tablet Take 81 mg by mouth daily.       Cholecalciferol (VITAMIN D3) 1000 UNITS tablet Take 1,000 Units by mouth daily.       gabapentin (NEURONTIN) 300 MG capsule Take 300 mg by mouth every other day.     levothyroxine (SYNTHROID, LEVOTHROID) 112 MCG tablet TAKE 1 TABLET BY MOUTH DAILY 90 tablet 3   losartan (COZAAR) 100 MG tablet Take 100 mg by mouth daily.     losartan-hydrochlorothiazide (HYZAAR) 100-12.5 MG tablet Take 1 tablet by mouth daily.     metFORMIN (GLUCOPHAGE) 500 MG tablet Take 500 mg by mouth every other day.     tamsulosin (FLOMAX) 0.4 MG CAPS capsule Take 0.4 mg by mouth daily.     triamcinolone  cream (KENALOG ) 0.1 % Apply 1 application topically 3 (three) times daily as needed. Rash 80 g 3   vitamin B-12 (CYANOCOBALAMIN ) 1000 MCG tablet Take  1,000 mcg by mouth every 3 (three) days.      amLODipine (NORVASC) 2.5 MG tablet Take 2.5 mg by mouth daily. (Patient not taking: Reported on 05/03/2024)     benzonatate  (TESSALON ) 100 MG capsule Take 1 capsule (100 mg total) by mouth 3 (three) times daily as needed for cough. (Patient not taking: Reported on 05/03/2024) 30 capsule 0   furosemide  (LASIX ) 20 MG tablet Take 1 tablet (20 mg total) by mouth daily as needed. (Patient not taking: Reported on 05/03/2024) 30 tablet 3   predniSONE  (DELTASONE ) 20 MG tablet Take 2 tablets (40 mg total) by mouth daily. (Patient not taking: Reported on 05/03/2024) 10 tablet 0   No current facility-administered medications for this visit.    REVIEW OF SYSTEMS  All other systems were reviewed and are negative     Objective:  Objective   Vitals:   05/03/24 1127  BP: 129/77  Pulse: (!) 52  Resp: 20  Temp: 98 F (36.7 C)  TempSrc:  Temporal  SpO2: 97%  Weight: 238 lb 6.4 oz (108.1 kg)  Height: 6' (1.829 m)   Body mass index is 32.33 kg/m.  Physical Exam General: no acute distress Cardiac: hemodynamically stable Pulm: normal work of breathing Abdomen: non-tender, no pulsatile mass Neuro: alert, no focal deficit Extremities: 1+ edema from ankle to mid shin bilaterally, no cyanosis or wounds Vascular:   Right: palpable femoral, DP  Left: palpable femoral, DP  Data: ABI +---------+------------------+-----+---------+--------+  Right   Rt Pressure (mmHg)IndexWaveform Comment   +---------+------------------+-----+---------+--------+  Brachial 147                                       +---------+------------------+-----+---------+--------+  PTA     168               1.14 triphasic          +---------+------------------+-----+---------+--------+  DP      185               1.26 triphasic          +---------+------------------+-----+---------+--------+  Great Toe118               0.80                    +---------+------------------+-----+---------+--------+   +---------+------------------+-----+---------+-------+  Left    Lt Pressure (mmHg)IndexWaveform Comment  +---------+------------------+-----+---------+-------+  Brachial 147                                      +---------+------------------+-----+---------+-------+  PTA     172               1.17 triphasic         +---------+------------------+-----+---------+-------+  DP      144               0.98 biphasic          +---------+------------------+-----+---------+-------+  Great Toe125               0.85                   +---------+------------------+-----+---------+-------+   +-------+-----------+-----------+------------+------------+  ABI/TBIToday's ABIToday's TBIPrevious ABIPrevious TBI  +-------+-----------+-----------+------------+------------+  Right 1.26       0.80                                  +-------+-----------+-----------+------------+------------+  Left  1.17       0.85                                 +-------+-----------+-----------+------------+------------+   DVT study from January reviewed Negative  CMP reviewed, Cr 0.97     Assessment/Plan:   TABB CROGHAN is a 83 y.o. male with neuropathy and lower extremity edema.  I did explain that his edema likely does tend of some component of chronic venous insufficiency although he does not have severe symptoms to warrant further investigation or intervention.  Explained that he does not have PAD based on his physical exam or his ABI today.  We discussed the foundation of treatment of CVI with compression, elevation and regular exercise. Ultimately regarding his numbness I explained that he should continue with his chiropractic care and follow-up with his spine specialist.  Follow-up prn   Philipp Brawn MD [facility]

## 2024-05-03 ENCOUNTER — Encounter: Payer: Self-pay | Admitting: Vascular Surgery

## 2024-05-03 ENCOUNTER — Ambulatory Visit: Attending: Vascular Surgery | Admitting: Vascular Surgery

## 2024-05-03 ENCOUNTER — Ambulatory Visit (HOSPITAL_COMMUNITY)
Admission: RE | Admit: 2024-05-03 | Discharge: 2024-05-03 | Disposition: A | Discharge status: Home / Self Care | Source: Ambulatory Visit | Attending: Vascular Surgery | Admitting: Vascular Surgery

## 2024-05-03 VITALS — BP 129/77 | HR 52 | Temp 98.0°F | Resp 20 | Ht 72.0 in | Wt 238.4 lb

## 2024-05-03 DIAGNOSIS — I872 Venous insufficiency (chronic) (peripheral): Secondary | ICD-10-CM | POA: Diagnosis not present

## 2024-05-03 DIAGNOSIS — M79606 Pain in leg, unspecified: Secondary | ICD-10-CM | POA: Diagnosis not present

## 2024-05-03 DIAGNOSIS — G629 Polyneuropathy, unspecified: Secondary | ICD-10-CM

## 2024-05-03 LAB — VAS US ABI WITH/WO TBI
Left ABI: 1.17
Right ABI: 1.26

## 2024-05-08 DIAGNOSIS — E1142 Type 2 diabetes mellitus with diabetic polyneuropathy: Secondary | ICD-10-CM | POA: Diagnosis not present

## 2024-05-08 DIAGNOSIS — E559 Vitamin D deficiency, unspecified: Secondary | ICD-10-CM | POA: Diagnosis not present

## 2024-05-08 DIAGNOSIS — I10 Essential (primary) hypertension: Secondary | ICD-10-CM | POA: Diagnosis not present

## 2024-05-08 DIAGNOSIS — D649 Anemia, unspecified: Secondary | ICD-10-CM | POA: Diagnosis not present

## 2024-06-17 DIAGNOSIS — H401112 Primary open-angle glaucoma, right eye, moderate stage: Secondary | ICD-10-CM | POA: Diagnosis not present

## 2024-06-17 DIAGNOSIS — H40012 Open angle with borderline findings, low risk, left eye: Secondary | ICD-10-CM | POA: Diagnosis not present

## 2024-06-17 DIAGNOSIS — H11421 Conjunctival edema, right eye: Secondary | ICD-10-CM | POA: Diagnosis not present

## 2024-06-17 DIAGNOSIS — E119 Type 2 diabetes mellitus without complications: Secondary | ICD-10-CM | POA: Diagnosis not present

## 2024-07-29 DIAGNOSIS — K080 Exfoliation of teeth due to systemic causes: Secondary | ICD-10-CM | POA: Diagnosis not present

## 2024-09-06 DIAGNOSIS — Z6833 Body mass index (BMI) 33.0-33.9, adult: Secondary | ICD-10-CM | POA: Diagnosis not present

## 2024-09-06 DIAGNOSIS — B372 Candidiasis of skin and nail: Secondary | ICD-10-CM | POA: Diagnosis not present

## 2024-09-06 DIAGNOSIS — B354 Tinea corporis: Secondary | ICD-10-CM | POA: Diagnosis not present

## 2024-10-29 DIAGNOSIS — I10 Essential (primary) hypertension: Secondary | ICD-10-CM | POA: Diagnosis not present

## 2024-10-29 DIAGNOSIS — G629 Polyneuropathy, unspecified: Secondary | ICD-10-CM | POA: Diagnosis not present

## 2024-10-29 DIAGNOSIS — E1142 Type 2 diabetes mellitus with diabetic polyneuropathy: Secondary | ICD-10-CM | POA: Diagnosis not present

## 2024-10-29 DIAGNOSIS — D649 Anemia, unspecified: Secondary | ICD-10-CM | POA: Diagnosis not present

## 2024-11-08 DIAGNOSIS — L309 Dermatitis, unspecified: Secondary | ICD-10-CM | POA: Diagnosis not present
